# Patient Record
Sex: Male | Born: 1970 | Race: White | Hispanic: No | Marital: Single | Smoking: Never smoker
Health system: Southern US, Community
[De-identification: ages and names within clinical notes are randomized; demographics above are authoritative.]

## PROBLEM LIST (undated history)

## (undated) DIAGNOSIS — F329 Major depressive disorder, single episode, unspecified: Secondary | ICD-10-CM

## (undated) DIAGNOSIS — I10 Essential (primary) hypertension: Secondary | ICD-10-CM

## (undated) DIAGNOSIS — F32A Depression, unspecified: Secondary | ICD-10-CM

## (undated) DIAGNOSIS — J45909 Unspecified asthma, uncomplicated: Secondary | ICD-10-CM

## (undated) HISTORY — PX: HERNIA REPAIR: SHX51

---

## 2015-12-03 ENCOUNTER — Emergency Department
Admission: EM | Admit: 2015-12-03 | Discharge: 2015-12-04 | Disposition: A | Discharge status: Other Acute Inpatient Hospital | Payer: Medicare Other | Attending: Emergency Medicine | Admitting: Emergency Medicine

## 2015-12-03 ENCOUNTER — Encounter: Payer: Self-pay | Admitting: Emergency Medicine

## 2015-12-03 DIAGNOSIS — I10 Essential (primary) hypertension: Secondary | ICD-10-CM | POA: Diagnosis not present

## 2015-12-03 DIAGNOSIS — F3175 Bipolar disorder, in partial remission, most recent episode depressed: Secondary | ICD-10-CM

## 2015-12-03 DIAGNOSIS — F1310 Sedative, hypnotic or anxiolytic abuse, uncomplicated: Secondary | ICD-10-CM | POA: Diagnosis not present

## 2015-12-03 DIAGNOSIS — R0600 Dyspnea, unspecified: Secondary | ICD-10-CM

## 2015-12-03 DIAGNOSIS — Z7951 Long term (current) use of inhaled steroids: Secondary | ICD-10-CM | POA: Insufficient documentation

## 2015-12-03 DIAGNOSIS — F329 Major depressive disorder, single episode, unspecified: Secondary | ICD-10-CM | POA: Diagnosis not present

## 2015-12-03 DIAGNOSIS — G2401 Drug induced subacute dyskinesia: Secondary | ICD-10-CM

## 2015-12-03 DIAGNOSIS — R0602 Shortness of breath: Secondary | ICD-10-CM | POA: Diagnosis present

## 2015-12-03 DIAGNOSIS — J45901 Unspecified asthma with (acute) exacerbation: Secondary | ICD-10-CM | POA: Diagnosis not present

## 2015-12-03 DIAGNOSIS — Z79899 Other long term (current) drug therapy: Secondary | ICD-10-CM | POA: Diagnosis not present

## 2015-12-03 DIAGNOSIS — J45909 Unspecified asthma, uncomplicated: Secondary | ICD-10-CM

## 2015-12-03 DIAGNOSIS — F32A Depression, unspecified: Secondary | ICD-10-CM

## 2015-12-03 DIAGNOSIS — R45851 Suicidal ideations: Secondary | ICD-10-CM

## 2015-12-03 HISTORY — DX: Major depressive disorder, single episode, unspecified: F32.9

## 2015-12-03 HISTORY — DX: Unspecified asthma, uncomplicated: J45.909

## 2015-12-03 HISTORY — DX: Essential (primary) hypertension: I10

## 2015-12-03 HISTORY — DX: Depression, unspecified: F32.A

## 2015-12-03 LAB — URINE DRUG SCREEN, QUALITATIVE (ARMC ONLY)
AMPHETAMINES, UR SCREEN: NOT DETECTED
BARBITURATES, UR SCREEN: NOT DETECTED
BENZODIAZEPINE, UR SCRN: NOT DETECTED
Cannabinoid 50 Ng, Ur ~~LOC~~: NOT DETECTED
Cocaine Metabolite,Ur ~~LOC~~: NOT DETECTED
MDMA (Ecstasy)Ur Screen: NOT DETECTED
METHADONE SCREEN, URINE: NOT DETECTED
Opiate, Ur Screen: NOT DETECTED
Phencyclidine (PCP) Ur S: NOT DETECTED
TRICYCLIC, UR SCREEN: POSITIVE — AB

## 2015-12-03 LAB — COMPREHENSIVE METABOLIC PANEL
ALBUMIN: 3.7 g/dL (ref 3.5–5.0)
ALK PHOS: 100 U/L (ref 38–126)
ALT: 20 U/L (ref 17–63)
ANION GAP: 6 (ref 5–15)
AST: 20 U/L (ref 15–41)
BILIRUBIN TOTAL: 0.6 mg/dL (ref 0.3–1.2)
BUN: 17 mg/dL (ref 6–20)
CALCIUM: 8.8 mg/dL — AB (ref 8.9–10.3)
CO2: 23 mmol/L (ref 22–32)
Chloride: 109 mmol/L (ref 101–111)
Creatinine, Ser: 1.13 mg/dL (ref 0.61–1.24)
GFR calc non Af Amer: >60 mL/min (ref >60)
GLUCOSE: 101 mg/dL — AB (ref 65–99)
POTASSIUM: 3.5 mmol/L (ref 3.5–5.1)
SODIUM: 138 mmol/L (ref 135–145)
TOTAL PROTEIN: 6.7 g/dL (ref 6.5–8.1)

## 2015-12-03 LAB — ACETAMINOPHEN LEVEL

## 2015-12-03 LAB — CBC
HCT: 45.5 % (ref 40.0–52.0)
Hemoglobin: 15.3 g/dL (ref 13.0–18.0)
MCH: 27.2 pg (ref 26.0–34.0)
MCHC: 33.7 g/dL (ref 32.0–36.0)
MCV: 80.6 fL (ref 80.0–100.0)
PLATELETS: 239 10*3/uL (ref 150–440)
RBC: 5.64 MIL/uL (ref 4.40–5.90)
RDW: 13.8 % (ref 11.5–14.5)
WBC: 10.9 10*3/uL — AB (ref 3.8–10.6)

## 2015-12-03 LAB — SALICYLATE LEVEL

## 2015-12-03 LAB — ETHANOL: Alcohol, Ethyl (B): 10 mg/dL — ABNORMAL HIGH (ref <5)

## 2015-12-03 NOTE — ED Notes (Signed)
Pt in room. No complaints or concerns voiced at this time. No abnormal behavior noted at this time. Will continue to monitor with q15 min checks. ODS officer in area. 

## 2015-12-03 NOTE — ED Provider Notes (Signed)
[facility] Emergency Department Provider Note  Time seen: 6:37 PM  I have reviewed the triage vital signs and the nursing notes.   HISTORY  Chief Complaint Shortness of Breath    HPI Marc Shaw is a 44 y.o. male with a past medical history of asthma, hypertension, depression, presents the emergency department with shortness of breath and suicidal ideation. According to the patient the past several days he has felt short of breath like his asthma was acting up. Once evaluated in the emergency department he states the real reason he came is because he has been depressed and is now feeling like killing himself. Patient has a plan to overdose on his medications which he states he has done in the past.     Past Medical History  Diagnosis Date  . Asthma   . Hypertension   . Depression     There are no active problems to display for this patient.   Past Surgical History  Procedure Laterality Date  . Hernia repair      Current Outpatient Rx  Name  Route  Sig  Dispense  Refill  . albuterol (PROVENTIL HFA;VENTOLIN HFA) 108 (90 BASE) MCG/ACT inhaler   Inhalation   Inhale 2 puffs into the lungs every 4 (four) hours as needed for wheezing or shortness of breath.         . ARIPiprazole (ABILIFY) 5 MG tablet   Oral   Take 5 mg by mouth daily.         Marland Kitchen. atorvastatin (LIPITOR) 40 MG tablet   Oral   Take 40 mg by mouth every evening.         . budesonide-formoterol (SYMBICORT) 160-4.5 MCG/ACT inhaler   Inhalation   Inhale 2 puffs into the lungs 2 (two) times daily.         . butalbital-acetaminophen-caffeine (FIORICET, ESGIC) 50-325-40 MG tablet   Oral   Take 1 tablet by mouth every 8 (eight) hours as needed for headache.         . clobetasol ointment (TEMOVATE) 0.05 %   Topical   Apply 1 application topically 2 (two) times daily.         . Emollient (AQUAPHOR ADVANCED THERAPY) OINT   Apply externally   Apply 1 application topically  daily as needed.         . fluocinonide (LIDEX) 0.05 % external solution   Topical   Apply 1 application topically 2 (two) times daily.         Marland Kitchen. FLUoxetine (PROZAC) 40 MG capsule   Oral   Take 40 mg by mouth daily.         . fluticasone (FLONASE) 50 MCG/ACT nasal spray   Each Nare   Place 1 spray into both nostrils daily.         . hydrocortisone (DERMAREST ECZEMA) 1 % lotion   Topical   Apply 1 application topically 2 (two) times daily.         Marland Kitchen. ibuprofen (ADVIL,MOTRIN) 200 MG tablet   Oral   Take 200 mg by mouth every 6 (six) hours as needed.         Marland Kitchen. lisinopril (PRINIVIL,ZESTRIL) 10 MG tablet   Oral   Take 10 mg by mouth daily.         Marland Kitchen. omeprazole (PRILOSEC) 20 MG capsule   Oral   Take 40 mg by mouth daily.         . Oxcarbazepine (TRILEPTAL) 300 MG tablet  Oral   Take 300 mg by mouth 2 (two) times daily.         . polyvinyl alcohol (LIQUIFILM TEARS) 1.4 % ophthalmic solution   Both Eyes   Place 1 drop into both eyes as needed for dry eyes.         Marland Kitchen QUEtiapine (SEROQUEL) 50 MG tablet   Oral   Take 50 mg by mouth 3 (three) times daily as needed.         . traZODone (DESYREL) 100 MG tablet   Oral   Take 100 mg by mouth at bedtime.           Allergies Sulfa antibiotics  No family history on file.  Social History Social History  Substance Use Topics  . Smoking status: Never Smoker   . Smokeless tobacco: None  . Alcohol Use: No    Review of Systems Constitutional: Negative for fever. Cardiovascular: Negative for chest pain. Respiratory: Patient states several days of shortness of breath. Gastrointestinal: Negative for abdominal pain Musculoskeletal: Negative for back pain. Neurological: Negative for headache 10-point ROS otherwise negative.  ____________________________________________   PHYSICAL EXAM:  VITAL SIGNS: ED Triage Vitals  Enc Vitals Group     BP 12/03/15 1717 142/70 mmHg     Pulse Rate 12/03/15 1717  98     Resp 12/03/15 1717 18     Temp 12/03/15 1717 98.9 F (37.2 C)     Temp Source 12/03/15 1717 Oral     SpO2 12/03/15 1717 95 %     Weight 12/03/15 1717 213 lb (96.616 kg)     Height 12/03/15 1717  (1.575 m)     Head Cir --      Peak Flow --      Pain Score 12/03/15 1717 0     Pain Loc --      Pain Edu? --      Excl. in GC? --     Constitutional: Alert and oriented. Well appearing and in no distress. Eyes: Normal exam ENT   Head: Normocephalic and atraumatic.   Mouth/Throat: Mucous membranes are moist. Cardiovascular: Normal rate, regular rhythm. No murmur Respiratory: Normal respiratory effort without tachypnea nor retractions. Breath sounds are clear, no wheezes on exam. Gastrointestinal: Soft and nontender. No distention.   Musculoskeletal: Nontender with normal range of motion in all extremities. Neurologic:  Normal speech and language. No gross focal neurologic deficits  Skin:  Skin is warm, dry and intact.  Psychiatric: Flat affect, states suicidal intention of overdosing on medication.  ____________________________________________   INITIAL IMPRESSION / ASSESSMENT AND PLAN / ED COURSE  Pertinent labs & imaging results that were available during my care of the patient were reviewed by me and considered in my medical decision making (see chart for details).  Patient presents the emergency department initially with a complaint of shortness breath, however upon evaluation the patient states he is really here because he is depressed and having thoughts of killing himself. Patient has clear lung sounds on exam, no wheeze, normal oxygen saturation. In addition to his normal psychiatric labs, I will add a troponin.  Overall the patient appears very well medically, however he does have a flat affect, with active suicidality. We will place the patient under an involuntary commitment, until psychiatry can appropriately  evaluate.    ____________________________________________   FINAL CLINICAL IMPRESSION(S) / ED DIAGNOSES  Dyspnea Depression Suicidal ideation   Minna Antis, MD 12/03/15 2311

## 2015-12-03 NOTE — ED Notes (Signed)
Report received from RN Katie N.  Pt in room. No complaints or concerns voiced at this time. No abnormal behavior noted at this time. Will continue to monitor with q15 min checks. ODS officer in area. 

## 2015-12-03 NOTE — ED Notes (Signed)

## 2015-12-03 NOTE — BH Specialist Note (Signed)
Mr. Charmaine [place] is reported as being a ward of the state.  His Guardian is listed as Wynonia SoursVince McNigh [phone].  He currently is under the care of [facility] [place] Kayleen Memos- Jonte Joe -- [phone]. Persons are attempting to place a missing persons alert on Mr. Charmaine [place].

## 2015-12-03 NOTE — ED Notes (Signed)
[facility]

## 2015-12-03 NOTE — ED Notes (Signed)
C/o sob and cough, states he has hx of asthma, using inhaler with no relief, pt in no distress

## 2015-12-03 NOTE — ED Notes (Signed)
BEHAVIORAL HEALTH ROUNDING Patient sleeping: Yes.   Patient alert and oriented: not applicable Behavior appropriate: Yes.    Nutrition and fluids offered: No Toileting and hygiene offered: No Sitter present: q15 minute observations Law enforcement present: Yes [employer] 

## 2015-12-03 NOTE — BH Assessment (Signed)
Assessment Note  Marc Shaw is an 44 y.o. male. Marc Shaw reports that he arrived by EMS. He reports that he was at the [facility] and his asthma was flaring up and he felt suicidal.  He reports that the staff at [facility] called EMS due to the asthma attack. He reports that after the death of his parents in 10-May-2001 and 05/10/05 he has been having increased depressive symptoms.  He reports symptoms of anxiety and racing heart.  He reports that walking triggered his asthma. He reports that he walked about 2 miles today.  He denied having auditory or visual hallucinations.  He denied homicidal ideation or intent.  He denied use of alcohol or drugs.    Diagnosis: Depression Past Medical History:  Past Medical History  Diagnosis Date  . Asthma   . Hypertension   . Depression     Past Surgical History  Procedure Laterality Date  . Hernia repair      Family History: No family history on file.  Social History:  reports that he has never smoked. He does not have any smokeless tobacco history on file. He reports that he does not drink alcohol. His drug history is not on file.  Additional Social History:  Alcohol / Drug Use History of alcohol / drug use?: No history of alcohol / drug abuse  CIWA: CIWA-Ar BP: 131/83 mmHg Pulse Rate: 77 COWS:    Allergies:  Allergies  Allergen Reactions  . Sulfa Antibiotics Other (See Comments)    unknown    Home Medications:  (Not in a hospital admission)  OB/GYN Status:  No LMP for male patient.  General Assessment Data Location of Assessment: [facility] ED TTS Assessment: In system Is this a Tele or Face-to-Face Assessment?: Face-to-Face Is this an Initial Assessment or a Re-assessment for this encounter?: Initial Assessment Marital status: Single Maiden name: n/a Is patient pregnant?: No Pregnancy Status: No Living Arrangements: Alone Can pt return to current living arrangement?: Yes Admission Status: Voluntary Is patient capable of signing voluntary  admission?: Yes Referral Source: Self/Family/Friend Insurance type: Medicaid  Medical Screening Exam [facility] Walk-in ONLY) Medical Exam completed: Yes  Crisis Care Plan Living Arrangements: Alone Name of Psychiatrist: Denied Name of Therapist: Denied  Education Status Is patient currently in school?: No Current Grade: n/a Highest grade of school patient has completed: 12th Name of school: [employer] - [place], [place] Contact person: n/a  Risk to self with the past 6 months Suicidal Ideation: Yes-Currently Present Has patient been a risk to self within the past 6 months prior to admission? : Yes Suicidal Intent: No-Not Currently/Within Last 6 Months Has patient had any suicidal intent within the past 6 months prior to admission? : Yes Is patient at risk for suicide?: Yes Suicidal Plan?: Yes-Currently Present Has patient had any suicidal plan within the past 6 months prior to admission? : Yes Specify Current Suicidal Plan: Overdose on medications Access to Means: Yes Specify Access to Suicidal Means: has access to his medications in his home What has been your use of drugs/alcohol within the last 12 months?: None reported Previous Attempts/Gestures: Yes How many times?: 10 Other Self Harm Risks: None reported Triggers for Past Attempts: Unknown Intentional Self Injurious Behavior: None Family Suicide History: No Recent stressful life event(s):  (None reported) Persecutory voices/beliefs?: No Depression: Yes Depression Symptoms: Feeling worthless/self pity, Loss of interest in usual pleasures Substance abuse history and/or treatment for substance abuse?: No Suicide prevention information given to non-admitted patients: Not applicable  Risk to Others within the past 6 months Homicidal Ideation: No Does patient have any lifetime risk of violence toward others beyond the six months prior to admission? : No Thoughts of Harm to Others: No Current Homicidal Intent: No Current  Homicidal Plan: No Access to Homicidal Means: No Identified Victim: None reported History of harm to others?: No Assessment of Violence: None Noted Violent Behavior Description: None reported Does patient have access to weapons?: No Criminal Charges Pending?: No Does patient have a court date: No Is patient on probation?: No  Psychosis Hallucinations: None noted Delusions: None noted  Mental Status Report Appearance/Hygiene: Unremarkable, In scrubs Eye Contact: Fair Motor Activity: Unremarkable Speech: Slow, Soft Level of Consciousness: Alert Mood: Depressed Affect: Flat Anxiety Level: None Thought Processes: Coherent Judgement: Unimpaired Orientation: Person, Place, Situation, Time Obsessive Compulsive Thoughts/Behaviors: None  Cognitive Functioning Concentration: Normal Memory: Recent Intact IQ: Average Insight: Fair Impulse Control: Fair Appetite: Good Sleep: Decreased Vegetative Symptoms: None  ADLScreening [facility]) Patient's cognitive ability adequate to safely complete daily activities?: Yes Patient able to express need for assistance with ADLs?: Yes Independently performs ADLs?: Yes (appropriate for developmental age)  Prior Inpatient Therapy Prior Inpatient Therapy: Yes Prior Therapy Dates: December 2016 Prior Therapy Facilty/Provider(s): [facility] Reason for Treatment: Depression  Prior Outpatient Therapy Prior Outpatient Therapy: No Does patient have an ACCT team?: No Does patient have Intensive In-House Services?  : No Does patient have [facility] services? : No Does patient have P4CC services?: No  ADL Screening (condition at time of admission) Patient's cognitive ability adequate to safely complete daily activities?: Yes Patient able to express need for assistance with ADLs?: Yes Independently performs ADLs?: Yes (appropriate for developmental age)       Abuse/Neglect Assessment (Assessment to be complete while patient  is alone) Physical Abuse: Denies Verbal Abuse: Denies Sexual Abuse: Denies Exploitation of patient/patient's resources: Denies Self-Neglect: Denies Values / Beliefs Cultural Requests During Hospitalization: None Spiritual Requests During Hospitalization: None        Additional Information 1:1 In Past 12 Months?: No CIRT Risk: No Elopement Risk: No Does patient have medical clearance?: Yes     Disposition:  Disposition Initial Assessment Completed for this Encounter: Yes Disposition of Patient: Other dispositions (To be seen by the psychiatris)  On Site Evaluation by:   Reviewed with Physician:    Justice DeedsKeisha Gerrod Maule 12/03/2015 9:13 PM

## 2015-12-04 DIAGNOSIS — G2401 Drug induced subacute dyskinesia: Secondary | ICD-10-CM

## 2015-12-04 DIAGNOSIS — J45909 Unspecified asthma, uncomplicated: Secondary | ICD-10-CM

## 2015-12-04 DIAGNOSIS — F3175 Bipolar disorder, in partial remission, most recent episode depressed: Secondary | ICD-10-CM | POA: Diagnosis not present

## 2015-12-04 DIAGNOSIS — R45851 Suicidal ideations: Secondary | ICD-10-CM

## 2015-12-04 DIAGNOSIS — I10 Essential (primary) hypertension: Secondary | ICD-10-CM

## 2015-12-04 DIAGNOSIS — F329 Major depressive disorder, single episode, unspecified: Secondary | ICD-10-CM | POA: Diagnosis not present

## 2015-12-04 LAB — TROPONIN I

## 2015-12-04 MED ORDER — ALBUTEROL SULFATE HFA 108 (90 BASE) MCG/ACT IN AERS
2.0000 | INHALATION_SPRAY | RESPIRATORY_TRACT | Status: DC | PRN
Start: 2015-12-04 — End: 2015-12-04
  Filled 2015-12-04: qty 6.7

## 2015-12-04 MED ORDER — BUDESONIDE-FORMOTEROL FUMARATE 160-4.5 MCG/ACT IN AERO
2.0000 | INHALATION_SPRAY | Freq: Two times a day (BID) | RESPIRATORY_TRACT | Status: DC
  Administered 2015-12-04: 2 via RESPIRATORY_TRACT
  Filled 2015-12-04: qty 6

## 2015-12-04 MED ORDER — TRAZODONE HCL 100 MG PO TABS
100.0000 mg | ORAL_TABLET | Freq: Every day | ORAL | Status: DC
  Administered 2015-12-04: 100 mg via ORAL
  Filled 2015-12-04: qty 1

## 2015-12-04 MED ORDER — ATORVASTATIN CALCIUM 20 MG PO TABS: 40.0000 mg | ORAL_TABLET | Freq: Every day | ORAL | Status: DC

## 2015-12-04 MED ORDER — PANTOPRAZOLE SODIUM 40 MG PO TBEC
40.0000 mg | DELAYED_RELEASE_TABLET | Freq: Every day | ORAL | Status: DC
  Administered 2015-12-04: 40 mg via ORAL
  Filled 2015-12-04: qty 1

## 2015-12-04 MED ORDER — LISINOPRIL 10 MG PO TABS
10.0000 mg | ORAL_TABLET | Freq: Once | ORAL | Status: AC
  Administered 2015-12-04: 10 mg via ORAL
  Filled 2015-12-04: qty 1

## 2015-12-04 MED ORDER — OXCARBAZEPINE 300 MG PO TABS
300.0000 mg | ORAL_TABLET | Freq: Two times a day (BID) | ORAL | Status: DC
  Administered 2015-12-04 (×2): 300 mg via ORAL
  Filled 2015-12-04 (×2): qty 1

## 2015-12-04 MED ORDER — ARIPIPRAZOLE 5 MG PO TABS
5.0000 mg | ORAL_TABLET | Freq: Every day | ORAL | Status: DC
  Administered 2015-12-04: 5 mg via ORAL
  Filled 2015-12-04: qty 1

## 2015-12-04 MED ORDER — FLUOXETINE HCL 20 MG PO CAPS
40.0000 mg | ORAL_CAPSULE | Freq: Every day | ORAL | Status: DC
  Administered 2015-12-04: 40 mg via ORAL
  Filled 2015-12-04: qty 2

## 2015-12-04 NOTE — ED Notes (Signed)
Pt. Noted in room resting quietly;. No complaints or concerns voiced. No distress or abnormal behavior noted. Will continue to monitor with security cameras. Q 15 minute rounds continue. 

## 2015-12-04 NOTE — ED Notes (Signed)
Pt. Noted in room sitting on the side of the bed; . No complaints or concerns voiced. No distress or abnormal behavior noted. Will continue to monitor with security cameras. Q 15 minute rounds continue. 

## 2015-12-04 NOTE — ED Notes (Signed)

## 2015-12-04 NOTE — ED Notes (Signed)
Patient discharged ambulatory to group home, accompanied by caregiver. He denies SI or HI. Discharge instructions reviewed with patient, verbalizes understanding. Patient received copy of DC plan and all personal belongings.

## 2015-12-04 NOTE — ED Notes (Signed)
Patient meeting with psychiatrist. Currently calm and cooperative, denies SI or HI. No evidence of psychosis. Maintained on 15 minute checks and observation by security camera for safety. 

## 2015-12-04 NOTE — ED Notes (Addendum)
A telephone call was placed to client's guardian--Vince Mcknight--[phone] @ 23:40 per [place] Police Officer [place]; as per client has been reported as a missing person. Per Mr. Ledon SnareMcKnight; Mr. Charmaine Down[place]Dodge was released from [facility] yesterday; and his guardian had gotten him a room @ [facility] @ [address] [place], [place]. Per Mr. Ledon SnareMcKnight, Mr. Charmaine [place] wants to be in a hospital to be taken care of; and Mr. Charmaine [place] spoke with Mr. Ledon SnareMcKnight @ 23:50; and Mr. Charmaine [place] stated after he spoke with his guardian, "I'm willing to go back to [facility]." Mr. Ledon SnareMcKnight said that the staff here can call him at any time and to also contact [facility] [place]--[phone] and [facility]Ashley Marrow.

## 2015-12-04 NOTE — ED Notes (Signed)
Pt. Noted in room watching the tv and requesting medications; ER-MD made aware.. No complaints or concerns voiced. No distress or abnormal behavior noted. Will continue to monitor with security cameras. Q 15 minute rounds continue.

## 2015-12-04 NOTE — ED Notes (Signed)
Patient aware that he will be discharged back to group home later today. Discharge instructions reviewed with patient and e-signature obtained. 

## 2015-12-04 NOTE — ED Notes (Signed)
Patient resting quietly in room. No noted distress or abnormal behaviors noted. Will continue 15 minute checks and observation by security camera for safety. 

## 2015-12-04 NOTE — ED Provider Notes (Signed)
-----------------------------------------   7:22 AM on 12/04/2015 -----------------------------------------   Blood pressure 131/83, pulse 77, temperature 98 F (36.7 C), temperature source Oral, resp. rate 20, height 5\' 2"  (1.575 m), weight 213 lb (96.616 kg), SpO2 97 %.  The patient had no acute events since last update.  Calm and cooperative at this time.  Disposition is pending per Psychiatry/Behavioral Medicine team recommendations.     Rebecka ApleyAllison P Webster, MD 12/04/15 [id number]

## 2015-12-04 NOTE — Consult Note (Signed)
[facility] Consult   Reason for Consult:  Consult for this 44 year old man who came into the hospital last night stating suicidal thoughts Referring Physician:  [place] Patient Identification: Marc Shaw MRN:  [id number] Principal Diagnosis: Bipolar 1 disorder, depressed, partial remission ([place]) Diagnosis:   Patient Active Problem List   Diagnosis Date Noted  . Bipolar 1 disorder, depressed, partial remission ([place]) [F31.75] 12/04/2015  . Asthma [J45.909] 12/04/2015  . Hypertension [I10] 12/04/2015  . Suicidal ideation [R45.851] 12/04/2015  . Tardive dyskinesia [G24.01] 12/04/2015    Total Time spent with patient: 1 hour  Subjective:   Marc Shaw is a 44 y.o. male patient admitted with "last night I told the nurse I wanted to kill myself but that is not true".  HPI:  Patient came into the hospital last night by EMS. He tells me that EMS was originally called because he was out in public at a gas station and was having shortness of breath because his asthma was bad. When he was evaluated here last night apparently he said he was having suicidal thoughts. Patient admits that he said this but says that he did not actually want to kill himself and is not having any suicidal thoughts now. His mood has been down and sad a little bit. He has chronic mood disorder symptoms and just recently moved into a new group home. Additionally he misses his family particularly bad at this time of the year. He is feeling lonely. Sleep is adequate. Appetite is normal. He denies that he's having any auditory or visual hallucinations. Denies any suicidal or homicidal ideas. He says he is taking all of his medicines although he can only remember Prozac among his psychiatric medicines he knows he takes others.  Patient apparently just moved into this new group home within the last week. Prior to that he had been in a psychiatric admission to a hospital in [place]. Patient had been living mostly in  [place] in the homeless shelter. He is new to our community. Not clear to me right now who is being assigned to prescribe his psychiatric medicine.  Social history: Patient is originally from [place] but has lived in [place] for over 20 years. He lived with his parents until they died about 10 years ago. Since then has been in homeless shelters or group homes. Doesn't have any other family he stays in close contact with. He does have a legally appointed guardian.  Medical history: States he has high blood pressure and he has asthma. Has to use an inhaler every day or he gets short of breath. Denies any history of heart attack or stroke.  Substance abuse history: Patient denies that he drinks or abuses any other drugs and says he has never had a drug or alcohol problem.   Past Psychiatric History: Patient says he has had mental health problems all of his life. He graduated from high school but never worked or did anything but stay with his family. He estimates that he's had about 15 total psychiatric hospitalizations in his life. He has had suicide attempts in the past most recently a couple months ago when he overdosed on an antidepressant. He says he's been diagnosed with bipolar disorder.   Risk to Self: Suicidal Ideation: Yes-Currently Present Suicidal Intent: No-Not Currently/Within Last 6 Months Is patient at risk for suicide?: Yes Suicidal Plan?: Yes-Currently Present Specify Current Suicidal Plan: Overdose on medications Access to Means: Yes Specify Access to Suicidal Means: has access to his medications  in his home What has been your use of drugs/alcohol within the last 12 months?: None reported How many times?: 10 Other Self Harm Risks: None reported Triggers for Past Attempts: Unknown Intentional Self Injurious Behavior: None Risk to Others: Homicidal Ideation: No Thoughts of Harm to Others: No Current Homicidal Intent: No Current Homicidal Plan: No Access to  Homicidal Means: No Identified Victim: None reported History of harm to others?: No Assessment of Violence: None Noted Violent Behavior Description: None reported Does patient have access to weapons?: No Criminal Charges Pending?: No Does patient have a court date: No Prior Inpatient Therapy: Prior Inpatient Therapy: Yes Prior Therapy Dates: December 2016 Prior Therapy Facilty/Provider(s): [facility] Reason for Treatment: Depression Prior Outpatient Therapy: Prior Outpatient Therapy: No Does patient have an ACCT team?: No Does patient have Intensive In-House Services?  : No Does patient have [facility] services? : No Does patient have P4CC services?: No  Past Medical History:  Past Medical History  Diagnosis Date  . Asthma   . Hypertension   . Depression     Past Surgical History  Procedure Laterality Date  . Hernia repair     Family History: No family history on file. Family Psychiatric  History: Patient says he does not know there being any family history of mental health or substance abuse problems  Social History:  History  Alcohol Use No     History  Drug Use Not on file    Social History   Social History  . Marital Status: Single    Spouse Name: N/A  . Number of Children: N/A  . Years of Education: N/A   Social History Main Topics  . Smoking status: Never Smoker   . Smokeless tobacco: None  . Alcohol Use: No  . Drug Use: None  . Sexual Activity: Not Asked   Other Topics Concern  . None   Social History Narrative  . None   Additional Social History:    History of alcohol / drug use?: No history of alcohol / drug abuse                     Allergies:   Allergies  Allergen Reactions  . Sulfa Antibiotics Other (See Comments)    unknown    Labs:  Results for orders placed or performed during the hospital encounter of 12/03/15 (from the past 48 hour(s))  Comprehensive metabolic panel     Status: Abnormal   Collection Time: 12/03/15   6:07 PM  Result Value Ref Range   Sodium 138 135 - 145 mmol/L   Potassium 3.5 3.5 - 5.1 mmol/L   Chloride 109 101 - 111 mmol/L   CO2 23 22 - 32 mmol/L   Glucose, Bld 101 (H) 65 - 99 mg/dL   BUN 17 6 - 20 mg/dL   Creatinine, Ser 1.13 0.61 - 1.24 mg/dL   Calcium 8.8 (L) 8.9 - 10.3 mg/dL   Total Protein 6.7 6.5 - 8.1 g/dL   Albumin 3.7 3.5 - 5.0 g/dL   AST 20 15 - 41 U/L   ALT 20 17 - 63 U/L   Alkaline Phosphatase 100 38 - 126 U/L   Total Bilirubin 0.6 0.3 - 1.2 mg/dL   GFR calc non Af Amer >60 >60 mL/min   GFR calc Af Amer >60 >60 mL/min    Comment: (NOTE) The eGFR has been calculated using the CKD EPI equation. This calculation has not been validated in all clinical situations. eGFR's  persistently <60 mL/min signify possible Chronic Kidney Disease.    Anion gap 6 5 - 15  Ethanol (ETOH)     Status: Abnormal   Collection Time: 12/03/15  6:07 PM  Result Value Ref Range   Alcohol, Ethyl (B) 10 (H) <5 mg/dL    Comment:        LOWEST DETECTABLE LIMIT FOR SERUM ALCOHOL IS 5 mg/dL FOR MEDICAL PURPOSES ONLY   Salicylate level     Status: None   Collection Time: 12/03/15  6:07 PM  Result Value Ref Range   Salicylate Lvl <7.5 2.8 - 30.0 mg/dL  Acetaminophen level     Status: Abnormal   Collection Time: 12/03/15  6:07 PM  Result Value Ref Range   Acetaminophen (Tylenol), Serum <10 (L) 10 - 30 ug/mL    Comment:        THERAPEUTIC CONCENTRATIONS VARY SIGNIFICANTLY. A RANGE OF 10-30 ug/mL MAY BE AN EFFECTIVE CONCENTRATION FOR MANY PATIENTS. HOWEVER, SOME ARE BEST TREATED AT CONCENTRATIONS OUTSIDE THIS RANGE. ACETAMINOPHEN CONCENTRATIONS >150 ug/mL AT 4 HOURS AFTER INGESTION AND >50 ug/mL AT 12 HOURS AFTER INGESTION ARE OFTEN ASSOCIATED WITH TOXIC REACTIONS.   CBC     Status: Abnormal   Collection Time: 12/03/15  6:07 PM  Result Value Ref Range   WBC 10.9 (H) 3.8 - 10.6 K/uL   RBC 5.64 4.40 - 5.90 MIL/uL   Hemoglobin 15.3 13.0 - 18.0 g/dL   HCT 45.5 40.0 - 52.0 %   MCV  80.6 80.0 - 100.0 fL   MCH 27.2 26.0 - 34.0 pg   MCHC 33.7 32.0 - 36.0 g/dL   RDW 13.8 11.5 - 14.5 %   Platelets 239 150 - 440 K/uL  Urine Drug Screen, Qualitative ([facility] only)     Status: Abnormal   Collection Time: 12/03/15  6:07 PM  Result Value Ref Range   Tricyclic, Ur Screen POSITIVE (A) NONE DETECTED   Amphetamines, Ur Screen NONE DETECTED NONE DETECTED   MDMA (Ecstasy)Ur Screen NONE DETECTED NONE DETECTED   Cocaine Metabolite,Ur [employer] NONE DETECTED NONE DETECTED   Opiate, Ur Screen NONE DETECTED NONE DETECTED   Phencyclidine (PCP) Ur S NONE DETECTED NONE DETECTED   Cannabinoid 50 Ng, Ur [place] NONE DETECTED NONE DETECTED   Barbiturates, Ur Screen NONE DETECTED NONE DETECTED   Benzodiazepine, Ur Scrn NONE DETECTED NONE DETECTED   Methadone Scn, Ur NONE DETECTED NONE DETECTED    Comment: (NOTE) 170  Tricyclics, urine               Cutoff 1000 ng/mL 200  Amphetamines, urine             Cutoff 1000 ng/mL 300  MDMA (Ecstasy), urine           Cutoff 500 ng/mL 400  Cocaine Metabolite, urine       Cutoff 300 ng/mL 500  Opiate, urine                   Cutoff 300 ng/mL 600  Phencyclidine (PCP), urine      Cutoff 25 ng/mL 700  Cannabinoid, urine              Cutoff 50 ng/mL 800  Barbiturates, urine             Cutoff 200 ng/mL 900  Benzodiazepine, urine           Cutoff 200 ng/mL 1000 Methadone, urine  Cutoff 300 ng/mL 1100 1200 The urine drug screen provides only a preliminary, unconfirmed 1300 analytical test result and should not be used for non-medical 1400 purposes. Clinical consideration and professional judgment should 1500 be applied to any positive drug screen result due to possible 1600 interfering substances. A more specific alternate chemical method 1700 must be used in order to obtain a confirmed analytical result.  1800 Gas chromato graphy / mass spectrometry (GC/MS) is the preferred 1900 confirmatory method.   Troponin I     Status: None   Collection Time:  12/03/15  6:17 PM  Result Value Ref Range   Troponin I <0.03 <0.031 ng/mL    Current Facility-Administered Medications  Medication Dose Route Frequency Provider Last Rate Last Dose  . albuterol (PROVENTIL HFA;VENTOLIN HFA) 108 (90 BASE) MCG/ACT inhaler 2 puff  2 puff Inhalation Q4H PRN Harvest Dark, MD      . ARIPiprazole (ABILIFY) tablet 5 mg  5 mg Oral Daily Harvest Dark, MD   5 mg at 12/04/15 0909  . atorvastatin (LIPITOR) tablet 40 mg  40 mg Oral q1800 Harvest Dark, MD      . budesonide-formoterol [facility]) 160-4.5 MCG/ACT inhaler 2 puff  2 puff Inhalation BID Harvest Dark, MD   2 puff at 12/04/15 0910  . FLUoxetine (PROZAC) capsule 40 mg  40 mg Oral Daily Harvest Dark, MD   40 mg at 12/04/15 0909  . Oxcarbazepine (TRILEPTAL) tablet 300 mg  300 mg Oral BID Harvest Dark, MD   300 mg at 12/04/15 0909  . pantoprazole (PROTONIX) EC tablet 40 mg  40 mg Oral QAC breakfast Harvest Dark, MD   40 mg at 12/04/15 0910  . traZODone (DESYREL) tablet 100 mg  100 mg Oral QHS Harvest Dark, MD   100 mg at 12/04/15 0031   Current Outpatient Prescriptions  Medication Sig Dispense Refill  . albuterol (PROVENTIL HFA;VENTOLIN HFA) 108 (90 BASE) MCG/ACT inhaler Inhale 2 puffs into the lungs every 4 (four) hours as needed for wheezing or shortness of breath.    . ARIPiprazole (ABILIFY) 5 MG tablet Take 5 mg by mouth daily.    Marland Kitchen atorvastatin (LIPITOR) 40 MG tablet Take 40 mg by mouth every evening.    . budesonide-formoterol (SYMBICORT) 160-4.5 MCG/ACT inhaler Inhale 2 puffs into the lungs 2 (two) times daily.    . clobetasol ointment (TEMOVATE) 1.47 % Apply 1 application topically 2 (two) times daily.    Marland Kitchen FLUoxetine (PROZAC) 40 MG capsule Take 40 mg by mouth daily.    . fluticasone (FLONASE) 50 MCG/ACT nasal spray Place 1 spray into both nostrils daily.    Marland Kitchen ibuprofen (ADVIL,MOTRIN) 200 MG tablet Take 200 mg by mouth every 6 (six) hours as needed.    Marland Kitchen lisinopril  (PRINIVIL,ZESTRIL) 10 MG tablet Take 10 mg by mouth daily.    Marland Kitchen omeprazole (PRILOSEC) 20 MG capsule Take 40 mg by mouth daily.    . Oxcarbazepine (TRILEPTAL) 300 MG tablet Take 300 mg by mouth 2 (two) times daily.    . QUEtiapine (SEROQUEL) 50 MG tablet Take 50 mg by mouth 3 (three) times daily as needed.    . traZODone (DESYREL) 100 MG tablet Take 100 mg by mouth at bedtime.    . butalbital-acetaminophen-caffeine (FIORICET, ESGIC) 50-325-40 MG tablet Take 1 tablet by mouth every 8 (eight) hours as needed for headache.    . Emollient (AQUAPHOR ADVANCED THERAPY) OINT Apply 1 application topically daily as needed.    . fluocinonide (LIDEX) 0.05 % external  solution Apply 1 application topically 2 (two) times daily.    . hydrocortisone (DERMAREST ECZEMA) 1 % lotion Apply 1 application topically 2 (two) times daily.    . polyvinyl alcohol (LIQUIFILM TEARS) 1.4 % ophthalmic solution Place 1 drop into both eyes as needed for dry eyes.      Musculoskeletal: Strength & Muscle Tone: within normal limits Gait & Station: normal Patient leans: N/A  Psychiatric Specialty Exam: Review of Systems  Constitutional: Negative.   HENT: Negative.   Eyes: Negative.   Respiratory: Negative.   Cardiovascular: Negative.   Gastrointestinal: Negative.   Musculoskeletal: Negative.   Skin: Negative.   Neurological: Negative.   Psychiatric/Behavioral: Negative for depression, suicidal ideas, hallucinations, memory loss and substance abuse. The patient is not nervous/anxious and does not have insomnia.     Blood pressure 129/66, pulse 71, temperature 97.6 F (36.4 C), temperature source Oral, resp. rate 16, height 5' 2"  (1.575 m), weight 96.616 kg (213 lb), SpO2 99 %.Body mass index is 38.95 kg/(m^2).  General Appearance: Disheveled  Engineer, water::  Fair  Speech:  Slow  Volume:  Decreased  Mood:  Euthymic  Affect:  Constricted  Thought Process:  Goal Directed  Orientation:  Full (Time, Place, and Person)   Thought Content:  Negative  Suicidal Thoughts:  No  Homicidal Thoughts:  No  Memory:  Immediate;   Good Recent;   Fair Remote;   Fair  Judgement:  Fair  Insight:  Fair  Psychomotor Activity:  Decreased and TD  Concentration:  Fair  Recall:  [employer] of Knowledge:Fair  Language: Fair  Akathisia:  No  Handed:  Right  AIMS (if indicated):     Assets:  Communication Skills Desire for Improvement Financial Resources/Insurance Housing Resilience Social Support  ADL's:  Intact  Cognition: WNL  Sleep:      Treatment Plan Summary: Medication management and Plan This is a 44 year old man with chronic mental health problems who just recently relocated to our area. He made a suicidal statement yesterday but it sounds like it was fairly impulsive. He did not do anything to try and kill himself and he denies having any suicidal thoughts now. He admits that he's been feeling a little bit down recently because it's the holiday season and he is lonely. He has been compliant with his medicine and he denies any psychotic symptoms. Patient appears to me to be a little bit cognitively slow and in addition he appears to probably have tardive dyskinesia. As far as his depression symptoms seem to be improving and stabilizing. He is currently on medication that appears to include Seroquel and Prozac possibly also Tegretol and Abilify. Patient should have an outpatient provider in the community since he was just discharged from the hospital. He needs to continue current psychiatric medicine and follow-up with outpatient psychiatric care. Patient's asthma has stabilized. He will stay on his usual medication. Blood pressure was high when he first came in but has stabilized and improved now and he will continue current medicine. Suicidal thoughts have improved. The dyskinesia appears to be stable. No intervention required at this point. Case discussed with emergency room doctor. Involuntary commitment  discontinued. He can be discharged back to his group home.  Disposition: Patient does not meet criteria for psychiatric inpatient admission. Supportive therapy provided about ongoing stressors. Discussed crisis plan, support from social network, calling 911, coming to the Emergency Department, and calling Suicide Hotline.  Marc Shaw 12/04/2015 12:35 PM

## 2015-12-04 NOTE — ED Provider Notes (Signed)
-----------------------------------------   1:09 PM on 12/04/2015 -----------------------------------------   Blood pressure 129/66, pulse 71, temperature 97.6 F (36.4 C), temperature source Oral, resp. rate 16, height 5\' 2"  (1.575 m), weight 213 lb (96.616 kg), SpO2 99 %.  The patient had no acute events since last update.  Calm and cooperative at this time.  Seen and evaluated by the psychiatrist, Dr. Toni Amendlapacs who recommends discharge to home. Will give follow-up with [facility]. Patient is calm and cooperative at this time. Denies any suicidal or homicidal ideation.    Myrna Blazeravid Matthew Schaevitz, MD 12/04/15 [phone]

## 2015-12-04 NOTE — ED Notes (Signed)
Patient asleep in room. No noted distress or abnormal behavior. Will continue 15 minute checks and observation by security cameras for safety. 

## 2015-12-04 NOTE — Progress Notes (Signed)
LCSW contacted patients guardian Marc Shaw [phone] and he will advise [facility] that patient is discharged and ready for pick up. He will be picked up between 1-3 this afternoon. In consult with psychiatrist patient is not homicidal or suicidal and will access other supports if he starts to feel overwhelmed.

## 2015-12-25 ENCOUNTER — Emergency Department
Admission: EM | Admit: 2015-12-25 | Discharge: 2015-12-26 | Disposition: A | Discharge status: Home / Self Care | Payer: Medicare Other | Attending: Emergency Medicine | Admitting: Emergency Medicine

## 2015-12-25 ENCOUNTER — Encounter: Payer: Self-pay | Admitting: Medical Oncology

## 2015-12-25 DIAGNOSIS — Z7951 Long term (current) use of inhaled steroids: Secondary | ICD-10-CM | POA: Diagnosis not present

## 2015-12-25 DIAGNOSIS — I10 Essential (primary) hypertension: Secondary | ICD-10-CM | POA: Diagnosis present

## 2015-12-25 DIAGNOSIS — R45851 Suicidal ideations: Secondary | ICD-10-CM | POA: Diagnosis present

## 2015-12-25 DIAGNOSIS — F3175 Bipolar disorder, in partial remission, most recent episode depressed: Secondary | ICD-10-CM | POA: Diagnosis not present

## 2015-12-25 DIAGNOSIS — G2401 Drug induced subacute dyskinesia: Secondary | ICD-10-CM | POA: Diagnosis present

## 2015-12-25 DIAGNOSIS — Z79899 Other long term (current) drug therapy: Secondary | ICD-10-CM | POA: Insufficient documentation

## 2015-12-25 DIAGNOSIS — J45909 Unspecified asthma, uncomplicated: Secondary | ICD-10-CM | POA: Diagnosis present

## 2015-12-25 DIAGNOSIS — F1510 Other stimulant abuse, uncomplicated: Secondary | ICD-10-CM

## 2015-12-25 LAB — CBC
HCT: 45.1 % (ref 40.0–52.0)
Hemoglobin: 14.9 g/dL (ref 13.0–18.0)
MCH: 26.1 pg (ref 26.0–34.0)
MCHC: 33 g/dL (ref 32.0–36.0)
MCV: 79 fL — AB (ref 80.0–100.0)
PLATELETS: 178 10*3/uL (ref 150–440)
RBC: 5.71 MIL/uL (ref 4.40–5.90)
RDW: 14 % (ref 11.5–14.5)
WBC: 8.5 10*3/uL (ref 3.8–10.6)

## 2015-12-25 LAB — COMPREHENSIVE METABOLIC PANEL
ALT: 14 U/L — AB (ref 17–63)
AST: 18 U/L (ref 15–41)
Albumin: 4.1 g/dL (ref 3.5–5.0)
Alkaline Phosphatase: 82 U/L (ref 38–126)
Anion gap: 5 (ref 5–15)
BILIRUBIN TOTAL: 0.5 mg/dL (ref 0.3–1.2)
BUN: 15 mg/dL (ref 6–20)
CHLORIDE: 109 mmol/L (ref 101–111)
CO2: 25 mmol/L (ref 22–32)
CREATININE: 1.12 mg/dL (ref 0.61–1.24)
Calcium: 9 mg/dL (ref 8.9–10.3)
Glucose, Bld: 117 mg/dL — ABNORMAL HIGH (ref 65–99)
Potassium: 3.4 mmol/L — ABNORMAL LOW (ref 3.5–5.1)
Sodium: 139 mmol/L (ref 135–145)
TOTAL PROTEIN: 6.9 g/dL (ref 6.5–8.1)

## 2015-12-25 LAB — URINE DRUG SCREEN, QUALITATIVE (ARMC ONLY)
Amphetamines, Ur Screen: POSITIVE — AB
BARBITURATES, UR SCREEN: POSITIVE — AB
BENZODIAZEPINE, UR SCRN: NOT DETECTED
CANNABINOID 50 NG, UR ~~LOC~~: NOT DETECTED
Cocaine Metabolite,Ur ~~LOC~~: NOT DETECTED
MDMA (Ecstasy)Ur Screen: NOT DETECTED
Methadone Scn, Ur: NOT DETECTED
Opiate, Ur Screen: NOT DETECTED
PHENCYCLIDINE (PCP) UR S: NOT DETECTED
TRICYCLIC, UR SCREEN: POSITIVE — AB

## 2015-12-25 LAB — ETHANOL

## 2015-12-25 LAB — SALICYLATE LEVEL

## 2015-12-25 LAB — ACETAMINOPHEN LEVEL: Acetaminophen (Tylenol), Serum: <10 ug/mL — ABNORMAL LOW (ref 10–30)

## 2015-12-25 MED ORDER — ACETAMINOPHEN 500 MG PO TABS
ORAL_TABLET | ORAL | Status: AC
  Administered 2015-12-25: 1000 mg via ORAL
  Filled 2015-12-25: qty 2

## 2015-12-25 MED ORDER — ACETAMINOPHEN 500 MG PO TABS
1000.0000 mg | ORAL_TABLET | Freq: Once | ORAL | Status: AC
  Administered 2015-12-25: 1000 mg via ORAL

## 2015-12-25 NOTE — ED Notes (Addendum)
[phone]- Guardian phone number.

## 2015-12-25 NOTE — ED Notes (Signed)
Pt reports that he began having suicidal thoughts last night, reports plan to Overdose on his medications. Pt denies HI.

## 2015-12-25 NOTE — BH Assessment (Signed)
Assessment Note  Marc Shaw is an 44 y.o. male who presents to the ER via his friends/roommates, due to voicing SI with the plan of overdosing on his medications. Patient states, he has been depressed for several days. He was unable to share the specifics as to why he is depressed.   Patient is reports of having A Guardian and they are through "[employer]," and the main contact is [place], [phone]. Patient moved to [facility] approximately a month ago. Prior to the move, he was living in [place] in a homeless Shelter.  Patient gives limited information about what is going on.  He was guarded and majority of his answers are "I don't know..."  According to the On Call Guardian ([phone]) patient is living in a [address], due to "burning his bridges in [place]."  On today, he became frustrated and have a history of using the ER as a way to cope. He's been flagged at majority of the [facility] and he is not to be admitted or seen unless it's a true medical emergency.  He does have a history of swollen things, as a way to get admitted in the hospital but never with the intent of harming himself. Guardian expressed, "while in the ER, make it less comfortable as possible, make it miserable for him. If you make it too comfortable, he'll keep coming back. If he knows he going to stay, trust it won't be the last time you see him... There is no reason why he should have come to the ER. He called me today because he was frustrated about nothing. So he said he was going to the ER. At this boarding house, she(owner) cook for him three times a day, give him free transportation and everything. There is no logical reason as to why he came up there. He likes going to [employer]..."  Guardian also states, have no safety concerns for the patient. "He'll threaten self-harm but he will never do it..."   Diagnosis: Depression  Past Medical History:  Past Medical History  Diagnosis Date  . Asthma   .  Hypertension   . Depression     Past Surgical History  Procedure Laterality Date  . Hernia repair      Family History: No family history on file.  Social History:  reports that he has never smoked. He does not have any smokeless tobacco history on file. He reports that he does not drink alcohol. His drug history is not on file.  Additional Social History:  Alcohol / Drug Use Pain Medications: See PTA Prescriptions: See PTA Over the Counter: See PTA History of alcohol / drug use?: No history of alcohol / drug abuse Longest period of sobriety (when/how long): States he have no past history of abuse Negative Consequences of Use:  (States he have no past history of abuse) Withdrawal Symptoms:  (States he have no past history of abuse)  CIWA: CIWA-Ar BP: (!) 154/70 mmHg Pulse Rate: 76 COWS:    Allergies:  Allergies  Allergen Reactions  . Sulfa Antibiotics Other (See Comments)    unknown    Home Medications:  (Not in a hospital admission)  OB/GYN Status:  No LMP for male patient.  General Assessment Data Location of Assessment: [facility] ED TTS Assessment: In system Is this a Tele or Face-to-Face Assessment?: Face-to-Face Is this an Initial Assessment or a Re-assessment for this encounter?: Initial Assessment Marital status: Single Maiden name: n/a Is patient pregnant?: No Pregnancy Status:  No Living Arrangements: Non-relatives/Friends Can pt return to current living arrangement?: No Admission Status: Voluntary Is patient capable of signing voluntary admission?: No Referral Source: Self/Family/Friend Insurance type: Medicaid  Medical Screening Exam [facility] Walk-in ONLY) Medical Exam completed: Yes  Crisis Care Plan Living Arrangements: Non-relatives/Friends Legal Guardian: Other: (Durenda Hurt [employer] of [phone]) Name of Psychiatrist: None Name of Therapist: None  Education Status Is patient currently in school?: No Current Grade: n/a Highest grade of school  patient has completed: High School Diploma Name of school: [employer] - [place], [place] Contact person: n/a  Risk to self with the past 6 months Suicidal Ideation: Yes-Currently Present Has patient been a risk to self within the past 6 months prior to admission? : Yes Suicidal Intent: Yes-Currently Present Has patient had any suicidal intent within the past 6 months prior to admission? : Yes Is patient at risk for suicide?: Yes Suicidal Plan?: Yes-Currently Present Has patient had any suicidal plan within the past 6 months prior to admission? : Yes Specify Current Suicidal Plan: Overdose on medication Access to Means: Yes Specify Access to Suicidal Means: Have medications What has been your use of drugs/alcohol within the last 12 months?: None Previous Attempts/Gestures: Yes How many times?: 1 Other Self Harm Risks: overdose in the past, 09/2015 Triggers for Past Attempts: None known Intentional Self Injurious Behavior: None Family Suicide History: No Recent stressful life event(s): Other (Comment) (States of having none) Persecutory voices/beliefs?: No Depression: Yes Depression Symptoms: Loss of interest in usual pleasures, Guilt, Fatigue Substance abuse history and/or treatment for substance abuse?: No Suicide prevention information given to non-admitted patients: Not applicable  Risk to Others within the past 6 months Homicidal Ideation: No Does patient have any lifetime risk of violence toward others beyond the six months prior to admission? : No Thoughts of Harm to Others: No Current Homicidal Intent: No Current Homicidal Plan: No Access to Homicidal Means: No Identified Victim: None Reported History of harm to others?: No Assessment of Violence: None Noted Violent Behavior Description: None Reported Does patient have access to weapons?: No Criminal Charges Pending?: No Does patient have a court date: No Is patient on probation?: No  Psychosis Hallucinations:  None noted Delusions: None noted  Mental Status Report Appearance/Hygiene: In hospital gown, In scrubs, Unremarkable Eye Contact: Fair Motor Activity: Freedom of movement, Unremarkable Speech: Logical/coherent, Unremarkable Level of Consciousness: Alert Mood: Depressed, Sad, Pleasant Affect: Appropriate to circumstance Anxiety Level: None Thought Processes: Coherent, Relevant Judgement: Partial Orientation: Person, Place, Time, Situation, Appropriate for developmental age Obsessive Compulsive Thoughts/Behaviors: None  Cognitive Functioning Concentration: Normal Memory: Recent Intact, Remote Intact IQ: Average Insight: Fair Impulse Control: Poor Appetite: Good Weight Loss: 0 Weight Gain: 0 Sleep: No Change Total Hours of Sleep: 8 Vegetative Symptoms: None  ADLScreening [facility] Assessment Services) Patient's cognitive ability adequate to safely complete daily activities?: Yes Patient able to express need for assistance with ADLs?: Yes Independently performs ADLs?: Yes (appropriate for developmental age)  Prior Inpatient Therapy Prior Inpatient Therapy: Yes Prior Therapy Dates: 73 (He states he been inpatient with them 5 times) Prior Therapy Facilty/Provider(s): [facility], "Waldemar Dickens"  Reason for Treatment: Depression  Prior Outpatient Therapy Prior Outpatient Therapy: Yes Prior Therapy Dates: 10/2015 Prior Therapy Facilty/Provider(s): Patient doesn't remember the name of the company Reason for Treatment: Depression-Medication Management Does patient have an ACCT team?: Unknown Does patient have Intensive In-House Services?  : No Does patient have [facility] services? : No Does patient have P4CC services?: No  ADL Screening (condition at time of admission) Patient's cognitive ability adequate to safely complete daily activities?: Yes Is the patient deaf or have difficulty hearing?: No Does the patient have difficulty seeing, even when wearing glasses/contacts?:  No Does the patient have difficulty concentrating, remembering, or making decisions?: No Patient able to express need for assistance with ADLs?: Yes Does the patient have difficulty dressing or bathing?: No Independently performs ADLs?: Yes (appropriate for developmental age) Does the patient have difficulty walking or climbing stairs?: No Weakness of Legs: None Weakness of Arms/Hands: None  Home Assistive Devices/Equipment Home Assistive Devices/Equipment: None  Therapy Consults (therapy consults require a physician order) PT Evaluation Needed: No OT Evalulation Needed: No SLP Evaluation Needed: No Abuse/Neglect Assessment (Assessment to be complete while patient is alone) Physical Abuse: Denies Verbal Abuse: Denies Sexual Abuse: Denies Exploitation of patient/patient's resources: Denies Self-Neglect: Denies Values / Beliefs Cultural Requests During Hospitalization: None Spiritual Requests During Hospitalization: None Consults Spiritual Care Consult Needed: No Social Work Consult Needed: No Merchant navy officerAdvance Directives (For Healthcare) Does patient have an advance directive?: No Would patient like information on creating an advanced directive?: No - patient declined information    Additional Information 1:1 In Past 12 Months?: No CIRT Risk: No Elopement Risk: No Does patient have medical clearance?: Yes  Child/Adolescent Assessment Running Away Risk: Denies (Patient is an adult)  Disposition:  Disposition Initial Assessment Completed for this Encounter: Yes Disposition of Patient: Other dispositions (To be seen by Psych MD) Other disposition(s): Other (Comment) (To be seen by Psych MD)  On Site Evaluation by:   Reviewed with Physician:     Lilyan Gilfordalvin J. Keyla Milone, MS, LCAS, LPC, NCC, CCSI 12/25/2015 6:28 PM

## 2015-12-25 NOTE — ED Notes (Signed)
Pt states that he has been feeling suicidal since last night. Pt states that he does not know why he is suicidal, he "just" is. Pt states that he would kill himself by taking all of his pills. Denies HI. Denies substance abuse.

## 2015-12-25 NOTE — ED Provider Notes (Signed)
[facility] Emergency Department Provider Note  ____________________________________________  Time seen: Approximately 7:20 PM  I have reviewed the triage vital signs and the nursing notes.   HISTORY  Chief Complaint Suicidal  History is limited by Limited cooperation, history of mental illness  HPI Finneus Kaneshiro is a 44 y.o. male with an extensive psychiatric history who presents with complaints of feeling suicidal since last night.  He reports gradual onset of worsening depression, now anything in particular, just "everything" makes it worse.  Plan for suicide is overdose by taking all his medications.  Per guardian, pt has extensive psych history with ingestion of objects for attention requiring surgical intervention and numerous reports of SI.  Currently denies HI and hallucinations.  Denies any medical complaints or concerns.  Past Medical History  Diagnosis Date  . Asthma   . Hypertension   . Depression     Patient Active Problem List   Diagnosis Date Noted  . Bipolar 1 disorder, depressed, partial remission (HCC) 12/04/2015  . Asthma 12/04/2015  . Hypertension 12/04/2015  . Suicidal ideation 12/04/2015  . Tardive dyskinesia 12/04/2015    Past Surgical History  Procedure Laterality Date  . Hernia repair      Current Outpatient Rx  Name  Route  Sig  Dispense  Refill  . albuterol (PROVENTIL HFA;VENTOLIN HFA) 108 (90 BASE) MCG/ACT inhaler   Inhalation   Inhale 2 puffs into the lungs every 4 (four) hours as needed for wheezing or shortness of breath.         . ARIPiprazole (ABILIFY) 5 MG tablet   Oral   Take 5 mg by mouth daily.         Marland Kitchen atorvastatin (LIPITOR) 40 MG tablet   Oral   Take 40 mg by mouth every evening.         . budesonide-formoterol (SYMBICORT) 160-4.5 MCG/ACT inhaler   Inhalation   Inhale 2 puffs into the lungs 2 (two) times daily.         . butalbital-acetaminophen-caffeine (FIORICET, ESGIC) 50-325-40 MG  tablet   Oral   Take 1 tablet by mouth every 8 (eight) hours as needed for headache.         . clobetasol ointment (TEMOVATE) 0.05 %   Topical   Apply 1 application topically 2 (two) times daily.         . Emollient (AQUAPHOR ADVANCED THERAPY) OINT   Apply externally   Apply 1 application topically daily as needed.         . fluocinonide (LIDEX) 0.05 % external solution   Topical   Apply 1 application topically 2 (two) times daily.         Marland Kitchen FLUoxetine (PROZAC) 40 MG capsule   Oral   Take 40 mg by mouth daily.         . fluticasone (FLONASE) 50 MCG/ACT nasal spray   Each Nare   Place 1 spray into both nostrils daily.         . hydrocortisone (DERMAREST ECZEMA) 1 % lotion   Topical   Apply 1 application topically 2 (two) times daily.         Marland Kitchen ibuprofen (ADVIL,MOTRIN) 200 MG tablet   Oral   Take 200 mg by mouth every 6 (six) hours as needed.         Marland Kitchen lisinopril (PRINIVIL,ZESTRIL) 10 MG tablet   Oral   Take 10 mg by mouth daily.         Marland Kitchen omeprazole (  PRILOSEC) 20 MG capsule   Oral   Take 40 mg by mouth daily.         . Oxcarbazepine (TRILEPTAL) 300 MG tablet   Oral   Take 300 mg by mouth 2 (two) times daily.         . polyvinyl alcohol (LIQUIFILM TEARS) 1.4 % ophthalmic solution   Both Eyes   Place 1 drop into both eyes as needed for dry eyes.         Marland Kitchen. QUEtiapine (SEROQUEL) 50 MG tablet   Oral   Take 50 mg by mouth 3 (three) times daily as needed.         . traZODone (DESYREL) 100 MG tablet   Oral   Take 100 mg by mouth at bedtime.           Allergies Sulfa antibiotics  No family history on file.  Social History Social History  Substance Use Topics  . Smoking status: Never Smoker   . Smokeless tobacco: None  . Alcohol Use: No    Review of Systems Constitutional: No fever/chills Eyes: No visual changes. ENT: No sore throat. Cardiovascular: Denies chest pain. Respiratory: Denies shortness of  breath. Gastrointestinal: No abdominal pain.  No nausea, no vomiting.  No diarrhea.  No constipation. Genitourinary: Negative for dysuria. Musculoskeletal: Negative for back pain. Skin: Negative for rash. Neurological: Negative for headaches, focal weakness or numbness. Psych:  Endorses SI and worsening depression  10-point ROS otherwise negative.  ____________________________________________   PHYSICAL EXAM:  VITAL SIGNS: ED Triage Vitals  Enc Vitals Group     BP 12/25/15 1702 154/70 mmHg     Pulse Rate 12/25/15 1702 76     Resp 12/25/15 1702 18     Temp 12/25/15 1702 98.4 F (36.9 C)     Temp Source 12/25/15 1702 Oral     SpO2 12/25/15 1702 96 %     Weight 12/25/15 1702 218 lb (98.884 kg)     Height 12/25/15 1702 5\' 2"  (1.575 m)     Head Cir --      Peak Flow --      Pain Score 12/25/15 1717 0     Pain Loc --      Pain Edu? --      Excl. in GC? --     Constitutional: Alert and oriented. Well appearing and in no acute distress. Eyes: Conjunctivae are normal. PERRL. EOMI. Head: Atraumatic. Nose: No congestion/rhinnorhea. Mouth/Throat: Mucous membranes are moist.  Oropharynx non-erythematous. Neck: No stridor.   Cardiovascular: Normal rate, regular rhythm. Grossly normal heart sounds.  Good peripheral circulation. Respiratory: Normal respiratory effort.  No retractions. Lungs CTAB. Gastrointestinal: Soft and nontender. No distention. No abdominal bruits. No CVA tenderness. Musculoskeletal: No lower extremity tenderness nor edema.  No joint effusions. Neurologic:  Normal speech and language. No gross focal neurologic deficits are appreciated.  Skin:  Skin is warm, dry and intact. No rash noted. Psychiatric: Mood and affect are flat. Endorses active SI with a plan.  ____________________________________________   LABS (all labs ordered are listed, but only abnormal results are displayed)  Labs Reviewed  COMPREHENSIVE METABOLIC PANEL - Abnormal; Notable for the  following:    Potassium 3.4 (*)    Glucose, Bld 117 (*)    ALT 14 (*)    All other components within normal limits  ACETAMINOPHEN LEVEL - Abnormal; Notable for the following:    Acetaminophen (Tylenol), Serum <10 (*)    All other components within normal limits  CBC - Abnormal; Notable for the following:    MCV 79.0 (*)    All other components within normal limits  URINE DRUG SCREEN, QUALITATIVE ([facility] ONLY) - Abnormal; Notable for the following:    Tricyclic, Ur Screen POSITIVE (*)    Amphetamines, Ur Screen POSITIVE (*)    Barbiturates, Ur Screen POSITIVE (*)    All other components within normal limits  ETHANOL  SALICYLATE LEVEL   ____________________________________________  EKG  Not indicated ____________________________________________  RADIOLOGY   No results found.  ____________________________________________   PROCEDURES  Procedure(s) performed: None  Critical Care performed: No ____________________________________________   INITIAL IMPRESSION / ASSESSMENT AND PLAN / ED COURSE  Pertinent labs & imaging results that were available during my care of the patient were reviewed by me and considered in my medical decision making (see chart for details).  Calvin already spoke with the patient and the guardian and he does have an extensive history of [place] and mental illness.  The guardian warned Jerilynn Som that the patient will eat things so that he will require admission.  We will keep a close eye on him and await Dr. Toni Amend evaluation.  A review of medical records shows that Dr. Toni Amend has seen him in the past.  I feel that this presentation is very similar to his prior presentation and that he does not represent an active danger of killing himself and he very much wants to be here rather than at the group home.  I will not place him under IVC at this time and if he decides that he wants to go back to his home that is  acceptable.  No acute medical illness currently.  ____________________________________________  FINAL CLINICAL IMPRESSION(S) / ED DIAGNOSES  Final diagnoses:  Bipolar 1 disorder, depressed, partial remission (HCC)  Suicidal ideation      NEW MEDICATIONS STARTED DURING THIS VISIT:  New Prescriptions   No medications on file     Loleta Rose, MD 12/25/15 2330

## 2015-12-26 DIAGNOSIS — R45851 Suicidal ideations: Secondary | ICD-10-CM

## 2015-12-26 DIAGNOSIS — F1510 Other stimulant abuse, uncomplicated: Secondary | ICD-10-CM

## 2015-12-26 NOTE — Discharge Instructions (Signed)
Bipolar Disorder °Bipolar disorder is a mental illness. The term bipolar disorder actually is used to describe a group of disorders that all share varying degrees of emotional highs and lows that can interfere with daily functioning, such as work, school, or relationships. Bipolar disorder also can lead to drug abuse, hospitalization, and suicide. °The emotional highs of bipolar disorder are periods of elation or irritability and high energy. These highs can range from a mild form (hypomania) to a severe form (mania). People experiencing episodes of hypomania may appear energetic, excitable, and highly productive. People experiencing mania may behave impulsively or erratically. They often make poor decisions. They may have difficulty sleeping. The most severe episodes of mania can involve having very distorted beliefs or perceptions about the world and seeing or hearing things that are not real (psychotic delusions and hallucinations).  °The emotional lows of bipolar disorder (depression) also can range from mild to severe. Severe episodes of bipolar depression can involve psychotic delusions and hallucinations. °Sometimes people with bipolar disorder experience a state of mixed mood. Symptoms of hypomania or mania and depression are both present during this mixed-mood episode. °SIGNS AND SYMPTOMS °There are signs and symptoms of the episodes of hypomania and mania as well as the episodes of depression. The signs and symptoms of hypomania and mania are similar but vary in severity. They include: °· Inflated self-esteem or feeling of increased self-confidence. °· Decreased need for sleep. °· Unusual talkativeness (rapid or pressured speech) or the feeling of a need to keep talking. °· Sensation of racing thoughts or constant talking, with quick shifts between topics that may or may not be related (flight of ideas). °· Decreased ability to focus or concentrate. °· Increased purposeful activity, such as work, studies,  or social activity, or nonproductive activity, such as pacing, squirming and fidgeting, or finger and toe tapping. °· Impulsive behavior and use of poor judgment, resulting in high-risk activities, such as having unprotected sex or spending excessive amounts of money. °Signs and symptoms of depression include the following:  °· Feelings of sadness, hopelessness, or helplessness. °· Frequent or uncontrollable episodes of crying. °· Lack of feeling anything or caring about anything. °· Difficulty sleeping or sleeping too much.  °· Inability to enjoy the things you used to enjoy.   °· Desire to be alone all the time.   °· Feelings of guilt or worthlessness.  °· Lack of energy or motivation.   °· Difficulty concentrating, remembering, or making decisions.  °· Change in appetite or weight beyond normal fluctuations. °· Thoughts of death or the desire to harm yourself. °DIAGNOSIS  °Bipolar disorder is diagnosed through an assessment by your caregiver. Your caregiver will ask questions about your emotional episodes. There are two main types of bipolar disorder. People with type I bipolar disorder have manic episodes with or without depressive episodes. People with type II bipolar disorder have hypomanic episodes and major depressive episodes, which are more serious than mild depression. The type of bipolar disorder you have can make an important difference in how your illness is monitored and treated. °Your caregiver may ask questions about your medical history and use of alcohol or drugs, including prescription medication. Certain medical conditions and substances also can cause emotional highs and lows that resemble bipolar disorder (secondary bipolar disorder).  °TREATMENT  °Bipolar disorder is a long-term illness. It is best controlled with continuous treatment rather than treatment only when symptoms occur. The following treatments can be prescribed for bipolar disorders: °· Medication--Medication can be prescribed by  a doctor that   is an expert in treating mental disorders (psychiatrists). Medications called mood stabilizers are usually prescribed to help control the illness. Other medications are sometimes added if symptoms of mania, depression, or psychotic delusions and hallucinations occur despite the use of a mood stabilizer. °· Talk therapy--Some forms of talk therapy are helpful in providing support, education, and guidance. °A combination of medication and talk therapy is best for managing the disorder over time. A procedure in which electricity is applied to your brain through your scalp (electroconvulsive therapy) is used in cases of severe mania when medication and talk therapy do not work or work too slowly. °  °This information is not intended to replace advice given to you by your health care provider. Make sure you discuss any questions you have with your health care provider. °  °Document Released: 03/22/2001 Document Revised: 01/04/2015 Document Reviewed: 01/09/2013 °Elsevier Interactive Patient Education ©2016 Elsevier Inc. ° °Suicidal Feelings: How to Help Yourself °Suicide is the taking of one's own life. If you feel as though life is getting too tough to handle and are thinking about suicide, get help right away. To get help: °· Call your local emergency services (911 in the [place] °· Call a suicide hotline to speak with a trained counselor who understands how you are feeling. The following is a list of suicide hotlines in the [place]. For a list of hotlines in [place], visit www.suicide.org/hotlines/international/[place]-suicide-hotlines.html. °¨  1-800-273-TALK (1-800-273-8255). °¨  1-800-SUICIDE (1-800-784-2433). °¨  1-888-628-9454. This is a hotline for Spanish speakers. °¨  1-800-799-4TTY (1-800-799-4889). This is a hotline for TTY users. °¨  1-866-4-U-TREVOR (1-866-488-7386). This is a hotline for lesbian, gay, bisexual, transgender, or questioning youth. °· Contact a crisis center or a local  suicide prevention center. To find a crisis center or suicide prevention center: °¨ Call your local hospital, clinic, community service organization, mental health center, social service provider, or health department. Ask for assistance in connecting to a crisis center. °¨ Visit www.suicidepreventionlifeline.org/getinvolved/locator for a list of crisis centers in the [place], or visit www.suicideprevention.ca/thinking-about-suicide/find-a-crisis-centre for a list of centers in [place]. °· Visit the following websites: °¨  National Suicide Prevention Lifeline: www.suicidepreventionlifeline.org °¨  Hopeline: www.hopeline.com °¨  American Foundation for Suicide Prevention: www.afsp.org °¨  The Trevor Project (for lesbian, gay, bisexual, transgender, or questioning youth): www.thetrevorproject.org °HOW CAN I HELP MYSELF FEEL BETTER? °· Promise yourself that you will not do anything drastic when you have suicidal feelings. Remember, there is hope. Many people have gotten through suicidal thoughts and feelings, and you will, too. You may have gotten through them before, and this proves that you can get through them again. °· Let family, friends, teachers, or counselors know how you are feeling. Try not to isolate yourself from those who care about you. Remember, they will want to help you. Talk with someone every day, even if you do not feel sociable. Face-to-face conversation is best. °· Call a mental health professional and see one regularly. °· Visit your primary health care provider every year. °· Eat a well-balanced diet, and space your meals so you eat regularly. °· Get plenty of rest. °· Avoid alcohol and drugs, and remove them from your home. They will only make you feel worse. °· If you are thinking of taking a lot of medicine, give your medicine to someone who can give it to you one day at a time. If you are on antidepressants and are concerned you will overdose, let your health care provider know so he or  she can give you safer medicines. Ask   your mental health professional about the possible side effects of any medicines you are taking. °· Remove weapons, poisons, knives, and anything else that could harm you from your home. °· Try to stick to routines. Follow a schedule every day. Put self-care on your schedule. °· Make a list of realistic goals, and cross them off when you achieve them. Accomplishments give a sense of worth. °· Wait until you are feeling better before doing the things you find difficult or unpleasant. °· Exercise if you are able. You will feel better if you exercise for even a half hour each day. °· Go out in the sun or into nature. This will help you recover from depression faster. If you have a favorite place to walk, go there. °· Do the things that have always given you pleasure. Play your favorite music, read a good book, paint a picture, play your favorite instrument, or do anything else that takes your mind off your depression if it is safe to do. °· Keep your living space well lit. °· When you are feeling well, write yourself a letter about tips and support that you can read when you are not feeling well. °· Remember that life's difficulties can be sorted out with help. Conditions can be treated. You can work on thoughts and strategies that serve you well. °  °This information is not intended to replace advice given to you by your health care provider. Make sure you discuss any questions you have with your health care provider. °  °Document Released: 06/20/2003 Document Revised: 01/04/2015 Document Reviewed: 04/10/2014 °Elsevier Interactive Patient Education ©2016 Elsevier Inc. ° °

## 2015-12-26 NOTE — ED Notes (Signed)
BEHAVIORAL HEALTH ROUNDING Patient sleeping: No. Patient alert and oriented: yes Behavior appropriate: Yes.  ; If no, describe:  Nutrition and fluids offered: Yes  Toileting and hygiene offered: Yes  Sitter present: no Law enforcement present: Yes  

## 2015-12-26 NOTE — Consult Note (Signed)
[facility] Face-to-Face Psychiatry Consult   Reason for Consult:  Consult for 44 year old man who came to the emergency room initially reporting being suicidal Referring Physician:  Joni Fears Patient Identification: Ranier Coach MRN:  [id number] Principal Diagnosis: Suicidal ideation Diagnosis:   Patient Active Problem List   Diagnosis Date Noted  . Amphetamine abuse [F15.10] 12/26/2015  . Bipolar 1 disorder, depressed, partial remission ([place]) [F31.75] 12/04/2015  . Asthma [J45.909] 12/04/2015  . Hypertension [I10] 12/04/2015  . Suicidal ideation [R45.851] 12/04/2015  . Tardive dyskinesia [G24.01] 12/04/2015    Total Time spent with patient: 1 hour  Subjective:   Marc Shaw is a 44 y.o. male patient admitted with "I had a suicidal attempt".  HPI:  Patient was interviewed. Chart reviewed. Old notes reviewed. Particularly reviewed the intake note from the TTS worker last night who spoke to the patient's guardian. This 44 year old man had himself brought over to the emergency room saying he was having suicidal ideation. He tells me today that he had a "suicide attempt" but I inquired about this and it's very clear that he didn't actually do anything to try to hurt himself. He says that last night he was feeling sad because he misses his parents both of whom died about 15 years ago. He says it's because the holiday he gets sad. Patient says he's been compliant with all of his medicine. He denies that he's been abusing any drugs or alcohol. His drug screen is positive for amphetamine and there is no evidence that he is being prescribed that currently. Patient appears to have recently been in the process of relocating from [facility] to [facility]. He apparently gets his outpatient treatment still from a provider in [place]. He denies that he's been having any hallucinations no auditory or visual hallucinations. Says that his mood was sad last night but goes up and down and today he is feeling  better. He denies any problems with his current breathing.  Social history: Patient is living by himself. He says he still living in [place] although the notes seem to indicate that he was in the process of being transferred to [facility]. He tells me that he thinks he's probably going to go back to [place] to live. He has a legal guardian who is not a family member. Sounds like he's pretty lonely at baseline.  Medical history: History of high blood pressure, COPD, elevated cholesterol and history of tardive dyskinesia  Substance abuse history: Patient denies having alcohol or drug abuse problems. It does appear from his drug screen however that he may have been taking controlled substances that he is not prescribed.  Past Psychiatric History: It's reported that the patient has a long history of making suicidal statements in order to come into the hospital as a way of coping with stress. Apparently in [place] he became a very frequent user of hospital level services to the point that a specific plan had to be made to do with him. There is evidently no evidence that he is ever tried to kill himself in the past. He is on chronic psychiatric medicine including a long-acting injectable but he doesn't remember the name of it. The diagnosis we are told he has his bipolar disorder. Denies any history of being violent with other people. Last psychiatric hospitalization was years ago.  Risk to Self: Suicidal Ideation: Yes-Currently Present Suicidal Intent: Yes-Currently Present Is patient at risk for suicide?: Yes Suicidal Plan?: Yes-Currently Present Specify Current Suicidal Plan: Overdose on medication Access  to Means: Yes Specify Access to Suicidal Means: Have medications What has been your use of drugs/alcohol within the last 12 months?: None How many times?: 1 Other Self Harm Risks: overdose in the past, 09/2015 Triggers for Past Attempts: None known Intentional Self Injurious Behavior:  None Risk to Others: Homicidal Ideation: No Thoughts of Harm to Others: No Current Homicidal Intent: No Current Homicidal Plan: No Access to Homicidal Means: No Identified Victim: None Reported History of harm to others?: No Assessment of Violence: None Noted Violent Behavior Description: None Reported Does patient have access to weapons?: No Criminal Charges Pending?: No Does patient have a court date: No Prior Inpatient Therapy: Prior Inpatient Therapy: Yes Prior Therapy Dates: 35 (He states he been inpatient with them 5 times) Prior Therapy Facilty/Provider(s): [facility], "[facility]"  Reason for Treatment: Depression Prior Outpatient Therapy: Prior Outpatient Therapy: Yes Prior Therapy Dates: 10/2015 Prior Therapy Facilty/Provider(s): Patient doesn't remember the name of the company Reason for Treatment: Depression-Medication Management Does patient have an ACCT team?: Unknown Does patient have Intensive In-House Services?  : No Does patient have [facility] services? : No Does patient have P4CC services?: No  Past Medical History:  Past Medical History  Diagnosis Date  . Asthma   . Hypertension   . Depression     Past Surgical History  Procedure Laterality Date  . Hernia repair     Family History: No family history on file. Family Psychiatric  History: Patient says his sister had a substance abuse problem but he denies any other family history at all. Social History:  History  Alcohol Use No     History  Drug Use Not on file    Social History   Social History  . Marital Status: Single    Spouse Name: N/A  . Number of Children: N/A  . Years of Education: N/A   Social History Main Topics  . Smoking status: Never Smoker   . Smokeless tobacco: None  . Alcohol Use: No  . Drug Use: None  . Sexual Activity: Not Asked   Other Topics Concern  . None   Social History Narrative   Additional Social History:    Pain Medications: See PTA Prescriptions:  See PTA Over the Counter: See PTA History of alcohol / drug use?: No history of alcohol / drug abuse Longest period of sobriety (when/how long): States he have no past history of abuse Negative Consequences of Use:  (States he have no past history of abuse) Withdrawal Symptoms:  (States he have no past history of abuse)                     Allergies:   Allergies  Allergen Reactions  . Sulfa Antibiotics Other (See Comments)    unknown    Labs:  Results for orders placed or performed during the hospital encounter of 12/25/15 (from the past 48 hour(s))  Comprehensive metabolic panel     Status: Abnormal   Collection Time: 12/25/15  5:08 PM  Result Value Ref Range   Sodium 139 135 - 145 mmol/L   Potassium 3.4 (L) 3.5 - 5.1 mmol/L   Chloride 109 101 - 111 mmol/L   CO2 25 22 - 32 mmol/L   Glucose, Bld 117 (H) 65 - 99 mg/dL   BUN 15 6 - 20 mg/dL   Creatinine, Ser 1.12 0.61 - 1.24 mg/dL   Calcium 9.0 8.9 - 10.3 mg/dL   Total Protein 6.9 6.5 - 8.1  g/dL   Albumin 4.1 3.5 - 5.0 g/dL   AST 18 15 - 41 U/L   ALT 14 (L) 17 - 63 U/L   Alkaline Phosphatase 82 38 - 126 U/L   Total Bilirubin 0.5 0.3 - 1.2 mg/dL   GFR calc non Af Amer >60 >60 mL/min   GFR calc Af Amer >60 >60 mL/min    Comment: (NOTE) The eGFR has been calculated using the CKD EPI equation. This calculation has not been validated in all clinical situations. eGFR's persistently <60 mL/min signify possible Chronic Kidney Disease.    Anion gap 5 5 - 15  Ethanol (ETOH)     Status: None   Collection Time: 12/25/15  5:08 PM  Result Value Ref Range   Alcohol, Ethyl (B) <5 <5 mg/dL    Comment:        LOWEST DETECTABLE LIMIT FOR SERUM ALCOHOL IS 5 mg/dL FOR MEDICAL PURPOSES ONLY   Salicylate level     Status: None   Collection Time: 12/25/15  5:08 PM  Result Value Ref Range   Salicylate Lvl <9.3 2.8 - 30.0 mg/dL  Acetaminophen level     Status: Abnormal   Collection Time: 12/25/15  5:08 PM  Result Value Ref Range    Acetaminophen (Tylenol), Serum <10 (L) 10 - 30 ug/mL    Comment:        THERAPEUTIC CONCENTRATIONS VARY SIGNIFICANTLY. A RANGE OF 10-30 ug/mL MAY BE AN EFFECTIVE CONCENTRATION FOR MANY PATIENTS. HOWEVER, SOME ARE BEST TREATED AT CONCENTRATIONS OUTSIDE THIS RANGE. ACETAMINOPHEN CONCENTRATIONS >150 ug/mL AT 4 HOURS AFTER INGESTION AND >50 ug/mL AT 12 HOURS AFTER INGESTION ARE OFTEN ASSOCIATED WITH TOXIC REACTIONS.   CBC     Status: Abnormal   Collection Time: 12/25/15  5:08 PM  Result Value Ref Range   WBC 8.5 3.8 - 10.6 K/uL   RBC 5.71 4.40 - 5.90 MIL/uL   Hemoglobin 14.9 13.0 - 18.0 g/dL   HCT 45.1 40.0 - 52.0 %   MCV 79.0 (L) 80.0 - 100.0 fL   MCH 26.1 26.0 - 34.0 pg   MCHC 33.0 32.0 - 36.0 g/dL   RDW 14.0 11.5 - 14.5 %   Platelets 178 150 - 440 K/uL  Urine Drug Screen, Qualitative ([facility] only)     Status: Abnormal   Collection Time: 12/25/15  5:08 PM  Result Value Ref Range   Tricyclic, Ur Screen POSITIVE (A) NONE DETECTED   Amphetamines, Ur Screen POSITIVE (A) NONE DETECTED   MDMA (Ecstasy)Ur Screen NONE DETECTED NONE DETECTED   Cocaine Metabolite,Ur [place] NONE DETECTED NONE DETECTED   Opiate, Ur Screen NONE DETECTED NONE DETECTED   Phencyclidine (PCP) Ur S NONE DETECTED NONE DETECTED   Cannabinoid 50 Ng, Ur [place] NONE DETECTED NONE DETECTED   Barbiturates, Ur Screen POSITIVE (A) NONE DETECTED   Benzodiazepine, Ur Scrn NONE DETECTED NONE DETECTED   Methadone Scn, Ur NONE DETECTED NONE DETECTED    Comment: (NOTE) 267  Tricyclics, urine               Cutoff 1000 ng/mL 200  Amphetamines, urine             Cutoff 1000 ng/mL 300  MDMA (Ecstasy), urine           Cutoff 500 ng/mL 400  Cocaine Metabolite, urine       Cutoff 300 ng/mL 500  Opiate, urine  Cutoff 300 ng/mL 600  Phencyclidine (PCP), urine      Cutoff 25 ng/mL 700  Cannabinoid, urine              Cutoff 50 ng/mL 800  Barbiturates, urine             Cutoff 200 ng/mL 900  Benzodiazepine, urine            Cutoff 200 ng/mL 1000 Methadone, urine                Cutoff 300 ng/mL 1100 1200 The urine drug screen provides only a preliminary, unconfirmed 1300 analytical test result and should not be used for non-medical 1400 purposes. Clinical consideration and professional judgment should 1500 be applied to any positive drug screen result due to possible 1600 interfering substances. A more specific alternate chemical method 1700 must be used in order to obtain a confirmed analytical result.  1800 Gas chromato graphy / mass spectrometry (GC/MS) is the preferred 1900 confirmatory method.     No current facility-administered medications for this encounter.   Current Outpatient Prescriptions  Medication Sig Dispense Refill  . albuterol (PROVENTIL HFA;VENTOLIN HFA) 108 (90 BASE) MCG/ACT inhaler Inhale 2 puffs into the lungs every 4 (four) hours as needed for wheezing or shortness of breath.    Marland Kitchen atorvastatin (LIPITOR) 40 MG tablet Take 40 mg by mouth every evening.    . budesonide-formoterol (SYMBICORT) 160-4.5 MCG/ACT inhaler Inhale 2 puffs into the lungs 2 (two) times daily.    Marland Kitchen FLUoxetine (PROZAC) 40 MG capsule Take 40 mg by mouth daily.    . fluticasone (FLONASE) 50 MCG/ACT nasal spray Place 1 spray into both nostrils daily.    Marland Kitchen lisinopril (PRINIVIL,ZESTRIL) 10 MG tablet Take 10 mg by mouth daily.    Marland Kitchen lithium carbonate (LITHOBID) 300 MG CR tablet Take 300 mg by mouth 2 (two) times daily.    Marland Kitchen omeprazole (PRILOSEC) 20 MG capsule Take 40 mg by mouth daily.    . Oxcarbazepine (TRILEPTAL) 300 MG tablet Take 300 mg by mouth 2 (two) times daily.    . traZODone (DESYREL) 100 MG tablet Take 100 mg by mouth at bedtime.      Musculoskeletal: Strength & Muscle Tone: decreased Gait & Station: ataxic Patient leans: N/A  Psychiatric Specialty Exam: Review of Systems  Constitutional: Negative.   HENT: Negative.   Eyes: Negative.   Respiratory: Negative.   Cardiovascular: Negative.    Gastrointestinal: Negative.   Musculoskeletal: Negative.   Skin: Negative.   Neurological: Negative.   Psychiatric/Behavioral: Negative for depression, suicidal ideas, hallucinations, memory loss and substance abuse. The patient is not nervous/anxious and does not have insomnia.     Blood pressure 152/81, pulse 71, temperature 97.8 F (36.6 C), temperature source Oral, resp. rate 18, height 5' 2"  (1.575 m), weight 98.884 kg (218 lb), SpO2 97 %.Body mass index is 39.86 kg/(m^2).  General Appearance: Disheveled  Eye Sport and exercise psychologist::  Fair  Speech:  Slow  Volume:  Decreased  Mood:  Euthymic  Affect:  Constricted  Thought Process:  Linear  Orientation:  Full (Time, Place, and Person)  Thought Content:  Negative  Suicidal Thoughts:  No  Homicidal Thoughts:  No  Memory:  Immediate;   Good Recent;   Fair Remote;   Fair  Judgement:  Impaired  Insight:  Shallow  Psychomotor Activity:  Decreased and TD  Concentration:  Fair  Recall:  [place]  Language: Fair  Akathisia:  No  Handed:  Right  AIMS (if indicated):     Assets:  Agricultural consultant Housing Resilience Social Support  ADL's:  Intact  Cognition: WNL  Sleep:      Treatment Plan Summary: Plan 43 year old man with a history of bipolar disorder and chronic mental health problems who came into the hospital claiming he had suicidal ideation in that he was sad and depressed. Symptoms were transient yesterday. He did not actually act on them. On evaluation today he denies any suicidal ideation. Suicidal ideation problem appears to be acutely resolved. History of bipolar disorder is on a [place] somewhat unstable but is back to baseline mood at this point. No sign of acute psychosis. Blood pressure is under reasonable control. No complaints of shortness of breath. Tardive dyskinesia is noted but appears to be stable. There is no other indication for hospital level treatment. Patient is not on  involuntary commitment and can be released from the emergency room. He will continue to follow-up with his outpatient psychiatric provider no change to medicine. Patient has been counseled to not miss use or use any medicines that he is not prescribed because of the risk of worsening his mental health problems.  Disposition: Patient does not meet criteria for psychiatric inpatient admission. Supportive therapy provided about ongoing stressors.  Elana Jian 12/26/2015 1:38 PM

## 2015-12-26 NOTE — ED Provider Notes (Signed)
-----------------------------------------   2:08 PM on 12/26/2015 -----------------------------------------  Case discussed with Dr. Toni Amendlapacs of psychiatry after his evaluation in the ED. Patient is medically stable with unremarkable vital signs, calm and comfortable. Per Dr. Toni Amendlapacs assessment, patient is psychiatrically stable as well and is currently denying any notable acute SI or HI or hallucinations.  Patient has an ACT team who he can follow up with closely. We'll discharge home with outpatient follow-up.  Marc CheekPhillip Marielys Trinidad, MD 12/26/15 1409

## 2015-12-26 NOTE — Progress Notes (Signed)
TTS has contacted the pts guardian Eric FormVick McNight @ [phone] and informed him that the pt is being prepared for for discharged. Pts guardian has requested that the pt call him personal to coordination discharge and transportation. TTS has relayed this message to the pt and provided the pt with the guardians' contact information.    12/26/2015 Marc FlashNicole Fariha Goto, MS, NCC, LPCA Therapeutic Triage Specialist

## 2015-12-26 NOTE — ED Provider Notes (Signed)
-----------------------------------------   8:22 AM on 12/26/2015 -----------------------------------------   Blood pressure 152/81, pulse 71, temperature 97.8 F (36.6 C), temperature source Oral, resp. rate 18, height 5\' 2"  (1.575 m), weight 218 lb (98.884 kg), SpO2 97 %.  The patient had no acute events since last update.  Calm and cooperative at this time.  Patient to be seen by psych     Rebecka ApleyAllison P Shell Blanchette, MD 12/26/15 [phone]

## 2015-12-26 NOTE — ED Notes (Signed)
Pt didn't want his lunch

## 2015-12-26 NOTE — ED Notes (Signed)
BEHAVIORAL HEALTH ROUNDING Patient sleeping: No. Patient alert and oriented: no Behavior appropriate: Yes.  ; If no, describe:  Nutrition and fluids offered: Yes  Toileting and hygiene offered: Yes  Sitter present: no Law enforcement present: Yes  

## 2015-12-26 NOTE — ED Notes (Addendum)
Wrong pt

## 2016-01-12 ENCOUNTER — Emergency Department: Payer: Medicare Other

## 2016-01-12 ENCOUNTER — Emergency Department
Admission: EM | Admit: 2016-01-12 | Discharge: 2016-01-12 | Disposition: A | Discharge status: Home / Self Care | Payer: Medicare Other | Attending: Emergency Medicine | Admitting: Emergency Medicine

## 2016-01-12 DIAGNOSIS — J45909 Unspecified asthma, uncomplicated: Secondary | ICD-10-CM

## 2016-01-12 DIAGNOSIS — Z7951 Long term (current) use of inhaled steroids: Secondary | ICD-10-CM | POA: Insufficient documentation

## 2016-01-12 DIAGNOSIS — I10 Essential (primary) hypertension: Secondary | ICD-10-CM | POA: Diagnosis not present

## 2016-01-12 DIAGNOSIS — Z79899 Other long term (current) drug therapy: Secondary | ICD-10-CM | POA: Diagnosis not present

## 2016-01-12 DIAGNOSIS — R0602 Shortness of breath: Secondary | ICD-10-CM | POA: Diagnosis present

## 2016-01-12 MED ORDER — PANTOPRAZOLE SODIUM 40 MG PO TBEC: 40.0000 mg | DELAYED_RELEASE_TABLET | Freq: Every day | ORAL | Status: DC

## 2016-01-12 MED ORDER — FLUOXETINE HCL 40 MG PO CAPS: 40.0000 mg | ORAL_CAPSULE | Freq: Every day | ORAL | Status: DC

## 2016-01-12 MED ORDER — ATORVASTATIN CALCIUM 20 MG PO TABS: 40.0000 mg | ORAL_TABLET | Freq: Every evening | ORAL | Status: DC

## 2016-01-12 MED ORDER — IPRATROPIUM-ALBUTEROL 0.5-2.5 (3) MG/3ML IN SOLN
3.0000 mL | Freq: Once | RESPIRATORY_TRACT | Status: AC
  Administered 2016-01-12: 3 mL via RESPIRATORY_TRACT
  Filled 2016-01-12: qty 3

## 2016-01-12 MED ORDER — OXCARBAZEPINE 300 MG PO TABS: 300.0000 mg | ORAL_TABLET | Freq: Two times a day (BID) | ORAL | Status: DC

## 2016-01-12 MED ORDER — LISINOPRIL 10 MG PO TABS
10.0000 mg | ORAL_TABLET | Freq: Every day | ORAL | Status: DC
Start: 2016-01-12 — End: 2016-01-12

## 2016-01-12 MED ORDER — ALBUTEROL SULFATE HFA 108 (90 BASE) MCG/ACT IN AERS: 1.0000 | INHALATION_SPRAY | RESPIRATORY_TRACT | Status: DC | PRN

## 2016-01-12 MED ORDER — FLUTICASONE PROPIONATE 50 MCG/ACT NA SUSP: 1.0000 | Freq: Every day | NASAL | Status: DC

## 2016-01-12 MED ORDER — BUDESONIDE-FORMOTEROL FUMARATE 160-4.5 MCG/ACT IN AERO: 2.0000 | INHALATION_SPRAY | Freq: Two times a day (BID) | RESPIRATORY_TRACT | Status: DC

## 2016-01-12 NOTE — Discharge Instructions (Signed)
Asthma, Adult Asthma is a condition of the lungs in which the airways tighten and narrow. Asthma can make it hard to breathe. Asthma cannot be cured, but medicine and lifestyle changes can help control it. Asthma may be started (triggered) by:  Animal skin flakes (dander).  Dust.  Cockroaches.  Pollen.  Mold.  Smoke.  Cleaning products.  Hair sprays or aerosol sprays.  Paint fumes or strong smells.  Cold air, weather changes, and winds.  Crying or laughing hard.  Stress.  Certain medicines or drugs.  Foods, such as dried fruit, potato chips, and sparkling grape juice.  Infections or conditions (colds, flu).  Exercise.  Certain medical conditions or diseases.  Exercise or tiring activities. HOME CARE   Take medicine as told by your doctor.  Use a peak flow meter as told by your doctor. A peak flow meter is a tool that measures how well the lungs are working.  Record and keep track of the peak flow meter's readings.  Understand and use the asthma action plan. An asthma action plan is a written plan for taking care of your asthma and treating your attacks.  To help prevent asthma attacks:  Do not smoke. Stay away from secondhand smoke.  Change your heating and air conditioning filter often.  Limit your use of fireplaces and wood stoves.  Get rid of pests (such as roaches and mice) and their droppings.  Throw away plants if you see mold on them.  Clean your floors. Dust regularly. Use cleaning products that do not smell.  Have someone vacuum when you are not home. Use a vacuum cleaner with a HEPA filter if possible.  Replace carpet with wood, tile, or vinyl flooring. Carpet can trap animal skin flakes and dust.  Use allergy-proof pillows, mattress covers, and box spring covers.  Wash bed sheets and blankets every week in hot water and dry them in a dryer.  Use blankets that are made of polyester or cotton.  Clean bathrooms and kitchens with bleach.  If possible, have someone repaint the walls in these rooms with mold-resistant paint. Keep out of the rooms that are being cleaned and painted.  Wash hands often. GET HELP IF:  You have make a whistling sound when breaking (wheeze), have shortness of breath, or have a cough even if taking medicine to prevent attacks.  The colored mucus you cough up (sputum) is thicker than usual.  The colored mucus you cough up changes from clear or white to yellow, green, gray, or bloody.  You have problems from the medicine you are taking such as:  A rash.  Itching.  Swelling.  Trouble breathing.  You need reliever medicines more than 2-3 times a week.  Your peak flow measurement is still at 50-79% of your personal best after following the action plan for 1 hour.  You have a fever. GET HELP RIGHT AWAY IF:   You seem to be worse and are not responding to medicine during an asthma attack.  You are short of breath even at rest.  You get short of breath when doing very little activity.  You have trouble eating, drinking, or talking.  You have chest pain.  You have a fast heartbeat.  Your lips or fingernails start to turn blue.  You are light-headed, dizzy, or faint.  Your peak flow is less than 50% of your personal best.   This information is not intended to replace advice given to you by your health care provider. Make sure   you discuss any questions you have with your health care provider.   Document Released: 06/01/2008 Document Revised: 09/04/2015 Document Reviewed: 07/13/2013 Elsevier Interactive Patient Education 2016 Elsevier Inc.  

## 2016-01-12 NOTE — ED Provider Notes (Signed)
Medical screening examination/treatment/procedure(s) were conducted as a shared visit with non-physician practitioner(s) and myself.  I personally evaluated the patient during the encounter.  ----------------------------------------- 5:06 PM on 01/12/2016 -----------------------------------------  Patient is resting comfortably, clear lungs. No distress. He is very amicable. He reports improvement after nebulizer. Based on his current symptomatology and history I suspect he does not likely have pneumonia but rather probably has mild asthma. Unfortunately guardian not presently with the patient. We will plan to hold him in the ER until guardian can pick him up.    Sharyn CreamerMark Karma Ansley, MD 01/12/16 [phone]

## 2016-01-12 NOTE — ED Notes (Signed)
Marc LittlerNicole Shaw [phone]

## 2016-01-12 NOTE — ED Notes (Signed)
Per [place]ica, CSW, APS has been contacted at this time regarding patient.

## 2016-01-12 NOTE — ED Notes (Signed)
Ems pt to lobby via wheelchair and luggage, pt got into an argument at his group home , left with all his belongings , while walking became short of breath , requesting to see social worker to get him back to [place] , sats 97%, BP 142/88, clear lung sounds , hx asthma

## 2016-01-12 NOTE — ED Notes (Signed)
Per BancroftMonica, CSW, Martha LakeNicole, [employer] of [employer], coming to pick patient up.

## 2016-01-12 NOTE — ED Notes (Signed)
Monica, CSW in ED at this time. Called number listed for Guardian and spoke with Noreene LarssonJill at [employer].

## 2016-01-12 NOTE — ED Notes (Signed)
Pt D/C into the care of Lorenza CambridgeNicole Keiffer, [employer] [facility] [employer]. Joni Reiningicole denies comments/concerns at this time. Pt ambulatory to the lobby at this time.

## 2016-01-12 NOTE — ED Provider Notes (Signed)
[facility] Emergency Department Provider Note ____________________________________________  Time seen: Approximately 11:33 AM  I have reviewed the triage vital signs and the nursing notes.   HISTORY  Chief Complaint Asthma   HPI Marc Shaw is a 45 y.o. male is here with complaint of shortness of breath. Patient has a history of asthma and used his albuterol inhaler prior to his arrival in the emergency room. He states that he was leaving a " home" after an argument and packed up all his belongings and began walking. While he is walking he became short of breath, used his albuterol inhaler and now feels better except for sore throat. He states that 2 weeks ago he was diagnosed with strep throat and took all of his antibiotics.It is unclear exactly where the patient was living at the time however he became mad at someone, packed up his belongings and started walking.  He states that he lives in [place] and is requesting a taxi ride to [place].   Telephone calls that were made by nurses it was revealed that the patient has a guardian and is "an incompetent adult". The person who currently is acting as Merchandiser, retailsupervisor at the facility states that patient's guardian is not working today and will also not be working tomorrow due to the holiday. They suggested that he be sent to a hotel for a homeless shelter. CSW Maxine GlennMonica was called and has spoken several times with this person to get the guardian to come pick up this patient. Patient cannot be discharged due to his incompetent status from the hospital at this time.    Past Medical History  Diagnosis Date  . Asthma   . Hypertension   . Depression     Patient Active Problem List   Diagnosis Date Noted  . 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 Bipolar 1 disorder, depressed, partial remission (HCC) 12/04/2015  . Asthma 12/04/2015  . Hypertension 12/04/2015  . Suicidal ideation 12/04/2015  . Tardive dyskinesia 12/04/2015     Past Surgical History  Procedure Laterality Date  . Hernia repair      Current Outpatient Rx  Name  Route  Sig  Dispense  Refill  . albuterol (PROVENTIL HFA;VENTOLIN HFA) 108 (90 BASE) MCG/ACT inhaler   Inhalation   Inhale 2 puffs into the lungs every 4 (four) hours as needed for wheezing or shortness of breath.         Marland Kitchen. atorvastatin (LIPITOR) 40 MG tablet   Oral   Take 40 mg by mouth every evening.         . budesonide-formoterol (SYMBICORT) 160-4.5 MCG/ACT inhaler   Inhalation   Inhale 2 puffs into the lungs 2 (two) times daily.         Marland Kitchen. FLUoxetine (PROZAC) 40 MG capsule   Oral   Take 40 mg by mouth daily.         . fluticasone (FLONASE) 50 MCG/ACT nasal spray   Each Nare   Place 1 spray into both nostrils daily.         Marland Kitchen. lisinopril (PRINIVIL,ZESTRIL) 10 MG tablet   Oral   Take 10 mg by mouth daily.         Marland Kitchen. lithium carbonate (LITHOBID) 300 MG CR tablet   Oral   Take 300 mg by mouth 2 (two) times daily.         Marland Kitchen. omeprazole (PRILOSEC) 20 MG capsule   Oral   Take 40 mg by mouth daily.         .Marland Kitchen  Oxcarbazepine (TRILEPTAL) 300 MG tablet   Oral   Take 300 mg by mouth 2 (two) times daily.         . traZODone (DESYREL) 100 MG tablet   Oral   Take 100 mg by mouth at bedtime.           Allergies Sulfa antibiotics  No family history on file.  Social History Social History  Substance Use Topics  . Smoking status: Never Smoker   . Smokeless tobacco: Not on file  . Alcohol Use: No    Review of Systems Constitutional: No fever/chills ENT: Slight sore throat. Cardiovascular: Denies chest pain. Respiratory: Resolving shortness of breath. Occasional cough Gastrointestinal:  No nausea, no vomiting.   Musculoskeletal: Negative for back pain. Skin: Negative for rash. Neurological: Negative for headaches, focal weakness or numbness.  10-point ROS otherwise negative.  ____________________________________________   PHYSICAL  EXAM:  VITAL SIGNS: ED Triage Vitals  Enc Vitals Group     BP 01/12/16 1118 150/73 mmHg     Pulse Rate 01/12/16 1118 91     Resp 01/12/16 1118 16     Temp 01/12/16 1118 97.9 F (36.6 C)     Temp Source 01/12/16 1118 Oral     SpO2 01/12/16 1118 95 %     Weight 01/12/16 1118 211 lb (95.709 kg)     Height 01/12/16 1118 5\' 2"  (1.575 m)     Head Cir --      Peak Flow --      Pain Score --      Pain Loc --      Pain Edu? --      Excl. in GC? --     Constitutional: Alert and oriented. Well appearing and in no acute distress. Eyes: Conjunctivae are normal. PERRL. EOMI. Head: Atraumatic. Nose: No congestion/rhinnorhea. EACs and TMs are clear bilaterally. Mouth/Throat: Mucous membranes are moist.  Oropharynx non-erythematous. Minimal posterior drainage seen. Neck: No stridor.   Hematological/Lymphatic/Immunilogical: No cervical lymphadenopathy. Cardiovascular: Normal rate, regular rhythm. Grossly normal heart sounds.  Good peripheral circulation. Respiratory: Normal respiratory effort.  No retractions. Lungs CTAB. No expiratory wheezes are present at this time the patient does not appear to be any acute distress or shortness of breath as he is talking in complete sentences without any difficulty. Occasional nonproductive cough is heard infrequently. Gastrointestinal: Soft and nontender. No distention.  Musculoskeletal: No lower extremity tenderness nor edema.  No joint effusions. Neurologic:  Normal speech and language. No gross focal neurologic deficits are appreciated. No gait instability. Skin:  Skin is warm, dry and intact. No rash noted. Psychiatric: Mood and affect are normal. Speech and behavior are normal.  ____________________________________________   LABS (all labs ordered are listed, but only abnormal results are displayed)  Labs Reviewed - No data to display  RADIOLOGY  Chest x-ray per radiologist shows mild opacification over the left retrocardiac region with may  vascular structures, but cannot exclude early infection. ____________________________________________   PROCEDURES  Procedure(s) performed: None  Critical Care performed: No  ____________________________________________   INITIAL IMPRESSION / ASSESSMENT AND PLAN / ED COURSE  Pertinent labs & imaging results that were available during my care of the patient were reviewed by me and considered in my medical decision making (see chart for details).  Someone from the facility talked to one of nurses and gave information that the patient is legally a "incompetent adult". Apparently the guardian for this person is not going to be in the office on Monday due  to holiday weekend. She is calling to try and make arrangements for the patient either at a hotel or at the homeless shelter but was told that we cannot discharge him due to his legal status.  ----------------------------------------- 5:00 PM on 01/12/2016  spoke with Dr.Quale and Dr. Darnelle Catalan. About this patient currently patient is being housed in flex. Patient was moved from room 47 to room 42 stitches are to the nurse's station. ----------------------------------------- 5:01 PM on 01/12/2016      received to the nurse and Irene CSW that the director of this program would be coming to pick up the patient tonight. Patient currently will stay in flex for now. -----------------------------------------   -----------------------------------------   ____________________________________________   FINAL CLINICAL IMPRESSION(S) / ED DIAGNOSES  Final diagnoses:  Asthma, unspecified asthma severity, uncomplicated      Tommi Rumps, PA-C 01/15/16 1405  Jennye Moccasin, MD 01/21/16 [phone]

## 2016-01-12 NOTE — ED Notes (Signed)
Spoke with [place]ica, CSW. Per [facility] patient was seen in December and had an address listed for [address], [place]. Monica, CSW states that she has no phone number listed for the address in [place]. This RN spoke with patient regarding the [place] address. Pt states that he does not know the phone number and that the home he used to live in was a halfway house he was placed in by his guardian at this time. This RN gave patient a tray of food and a coke at this time. Pt laying on bed, no acute distress noted at this time. Monica, CSW made aware.

## 2016-01-12 NOTE — Clinical Social Work Note (Signed)
Clinical Social Work Assessment  Patient Details  Name: Marc Shaw MRN: [id number] Date of Birth: 06/11/1971  Date of referral:  01/12/16               Reason for consult:  Housing Concerns/Homelessness, Mental Health Concerns, Discharge Planning (Cognitive Concerns)                Permission sought to share information with:   (The [employer]) Permission granted to share information::  Yes, Verbal Permission Granted  Name::        Agency::     Relationship::     Contact Information:     Housing/Transportation Living arrangements for the past 2 months:  Hotel/Motel, Boarding House, No permanent address Source of Information:  Patient, Outpatient Provider Patient Interpreter Needed:  None Criminal Activity/Legal Involvement Pertinent to Current Situation/Hospitalization:  No - Comment as needed Significant Relationships:  Marc Shaw Support Lives with:  Self Do you feel safe going back to the place where you live?    Need for family participation in patient care:     Care giving concerns:  Patient is transient and does not have a permanent home and is developmentally delayed with MI diagnosis.   Social Worker assessment / plan:  CSW received call from [employer] Nurse in ED, Marc MilletMegan, stating that patient arrived to the ED with his luggage stating he got into a fight with a person he was staying with and cannot return there. Patient was able to tell the nurse that the [employer] is his guardian. Patient's knowledge and history of facts was not clear. CSW researched history and patient had been in the ED in December and was staying with a Marc Shaw at [address] but there was no phone number and patient did not know the phone number. There was a name and number for a Marc Shaw who is listed in patient's record as patient's guardian. CSW attempted to call both numbers listed for Marc Shaw and the [phone] went straight to voicemail and the other number: [phone] was patient's own cell number. Patient  did have a number for the on call [employer] Shaw at [phone]. Patient's ED nurse had already called this number and spoken to Marc Shaw about the situation and the nurse stated that Marc Shaw had recommended sending patient to a hotel or homeless shelter. As this was not a viable option due to the [employer] already stating patient had been deemed incompetent, the nurse contacted this CSW.  CSW spoke with a Marc Shaw number and she stated that their [employer] agency is patient's guardian and that Marc Shaw works during the weekday and that their on call service is for weekends and after hours. Marc Shaw stated that she did not know what to do about this situation and stated that she had called her supervisor but had not received a call back. Marc Shaw was unable to tell CSW how patient was back in [place] and that the there had been some record that the patient had been in [place] at a hotel but she was not sure. Marc Shaw was not able to confirm if any of that information was factual. CSW urged Marc Shaw to attempt to contact someone else in her organization as patient was not a reliable historian and had difficulty piecing together a timeline and series of events.   CSW spoke with patient this afternoon and he stated that his guardian works for [employer] and that his name was Marc Shaw. Patient was able to describe events in simple terminology. Patient reported  that he was in a hotel in [place] and then later changed the information to being at a homeless shelter in [place]. Patient could not tell me how long ago he was at those places but did state that his guardian, Marc Shaw, from [facility] drove him to stay with a lady, Marc Shaw, at a home that takes people in who have no where to stay. Patient stated he got on his phone last night and one of the people there heard him and got mad at something he said and he stated he got into a verbal argument and decided to leave. Patient stated he had no contact numbers for anyone because his phone erased  all of his contacts when he restarted it. Because the on call [employer] rep did not know what do advise and they are his guardian, CSW contacted Shaw APS on call. CSW spoke with Marc Shaw and informed her of the situation and she stated she would have to call her supervisor. In the meantime, while awaiting a call back from Shaw, Marc Shaw called and stated that she is very familiar with patient and that he has a transient history of not staying in one place for very long. Marc Shaw stated that the [facility] sets patient Shaw in various hotels from time to time and he will be functional for about 2 weeks. She states that patient likes to be waited on and picks a hotel or hospital as his place of choice. Marc Shaw stated that if we faxed her patient's discharge papers, she would sign them and that they would put patient Shaw in a hotel until staff could get to him. This CSW explained that with them stating he is an incompetent adult and requiring to have them as a guardian, that we would not be comfortable with that plan and that ideally it would be best for someone from the [facility] agency to come pick patient Shaw from the hospital. Marc Shaw stated she understood and that she would try to find someone who could come from [place] to get him. Marc Shaw called CSW back about 20 minutes later and stated that she could not find anyone else and that she would be coming from [place] this evening to pick patient Shaw. CSW has updated patient's nurse, Marc Shaw, regarding Marc Shaw.   Employment status:  Disabled (Comment on whether or not currently receiving Disability) Insurance information:  Medicaid In [place] PT Recommendations:  Not assessed at this time Information / Referral to community resources:     Patient/Family's Response to care:  Patient cooperative and pleasant with CSW.  Patient/Family's Understanding of and Emotional Response to Diagnosis, Current Treatment, and Prognosis:  Unsure  of patient's total understanding of situation.   Emotional Assessment Appearance:  Appears stated age, Disheveled Attitude/Demeanor/Rapport:   (pleasant and calm) Affect (typically observed):  Calm, Appropriate Orientation:  Oriented to Self, Oriented to Place, Oriented to  Time Alcohol / Substance use:  Not Applicable Psych involvement (Current and /or in the community):  Outpatient Provider  Discharge Needs  Concerns to be addressed:  Care Coordination, Basic Needs, Cognitive Concerns Readmission within the last 30 days:  No Current discharge risk:  Homeless Barriers to Discharge:  Other (Awaiting Arrangements By [facility])   York Spaniel, LCSW 01/12/2016, 4:28 PM

## 2016-01-12 NOTE — ED Notes (Signed)
Spoke with [place]ica, CSW regarding patient situation. Pt states he has no where to go at this time. Pt states he was staying with a friend in a halfway house and had an argument and can no longer go back. Pt brought phone up to the desk for this RN to speak with [employer] representative Carney BernJean. Per Carney BernJean pt is originally from [place] area and is "an incompetent adult". Carney BernJean asked this RN about homeless shelters in the area to send pt too. After conferring with charge RN, Dorinda Hillonald, and Tammy SoursGreg, RN, this nurse explained to Carney BernJean that medically he had no reason to stay but we could not D/C patient to a homeless shelter given that he is an incompetent adult. Per Carney BernJean, who states she is in [place] and is more of an "emergency/crisis line", there is no one to come pick him up. This RN notified Dorinda Hillonald, Press photographerCharge Nurse, [place]Monica, CSW, and [place]onda, [place] of the events. Consult to SW pending. Pt states that he has a court appointed guardian due to his mother not taking care of him.  Carney BernJean [facility] REP) [phone]

## 2016-01-12 NOTE — ED Notes (Signed)
Pt states when leaving his home after argument he felt SOB. Hx of Asthma. Used his albuterol inhaler. Pt states he feels better now but has a sore throat.

## 2017-06-27 IMAGING — CR DG CHEST 2V
2 series · 2 of 2 positions shown · non-contrast
Comparison: None.

CLINICAL DATA: Shortness of breath.  History of asthma.

EXAM:
CHEST  2 VIEW

[chest pa]
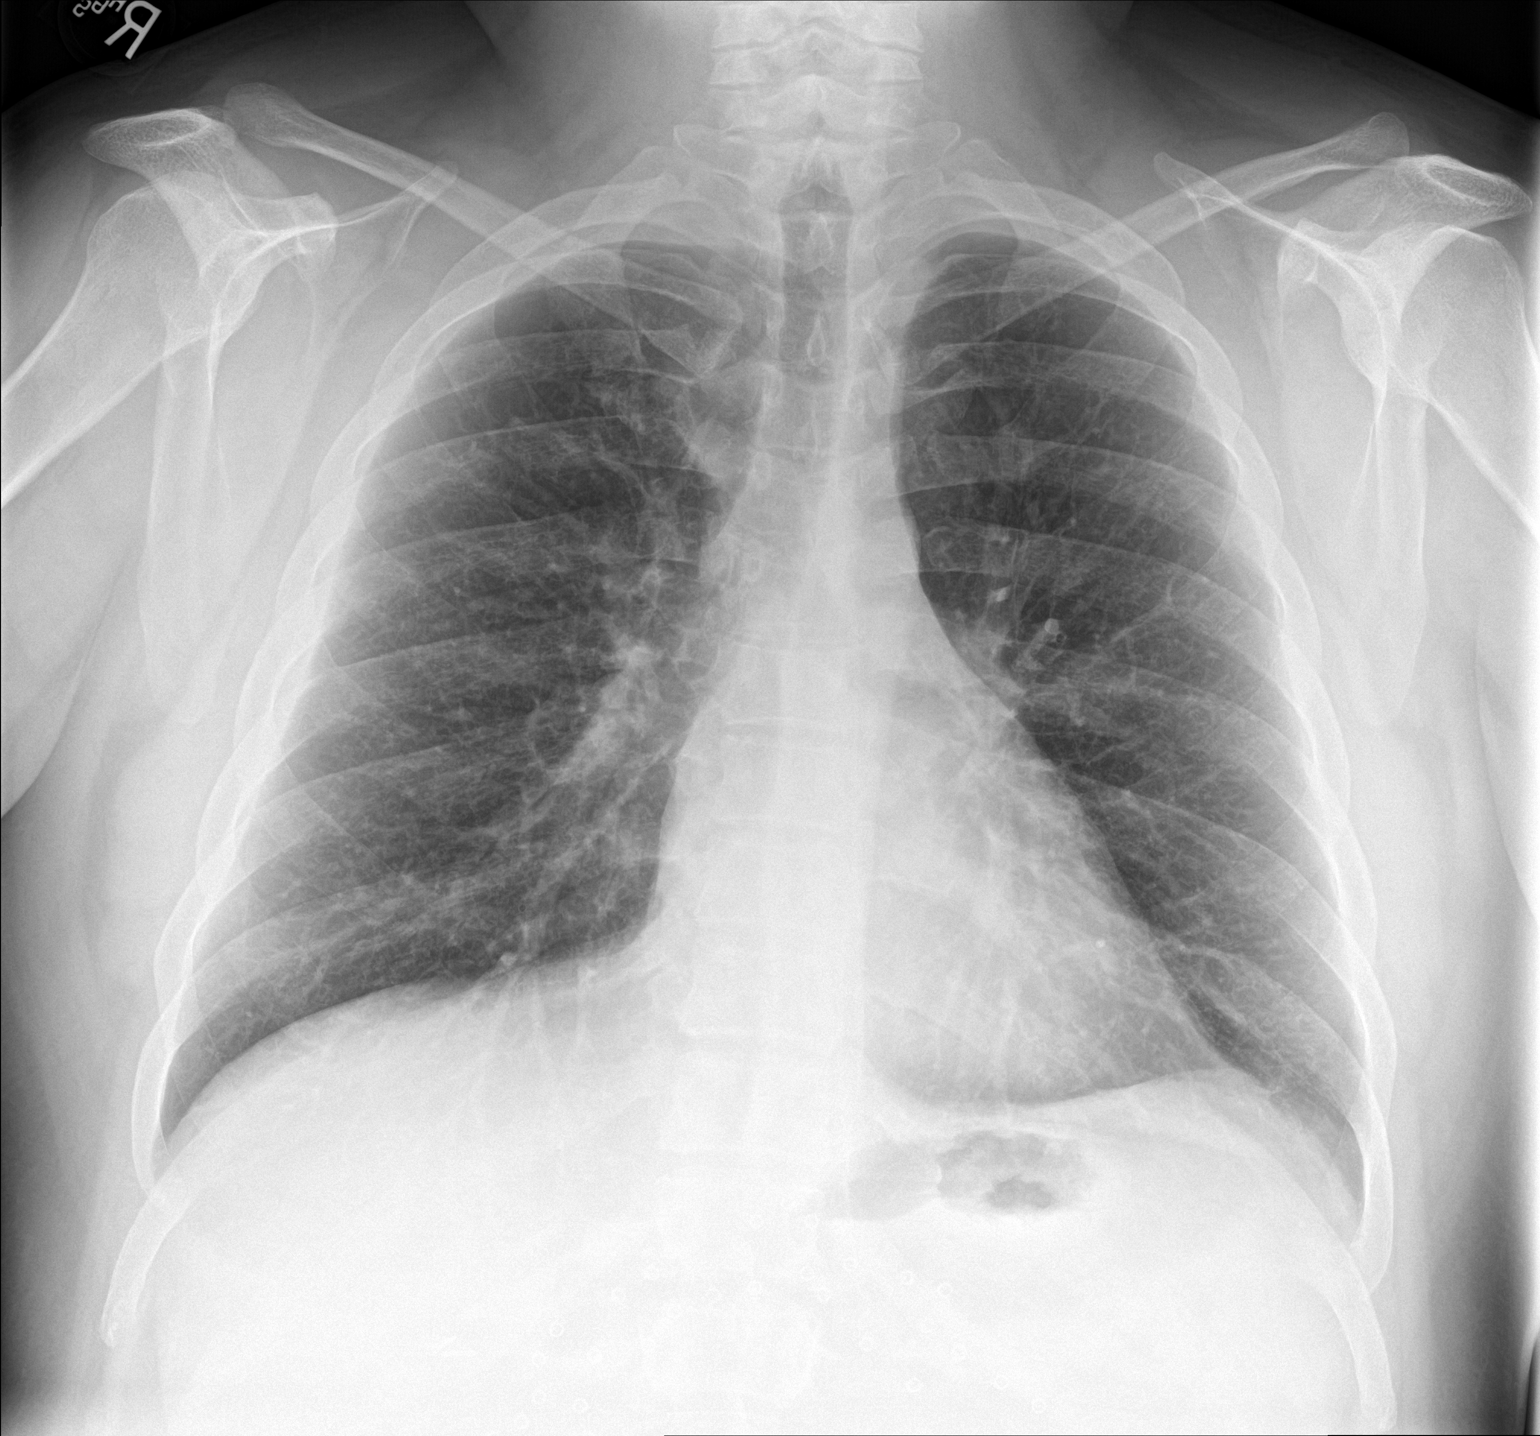

[chest lat]
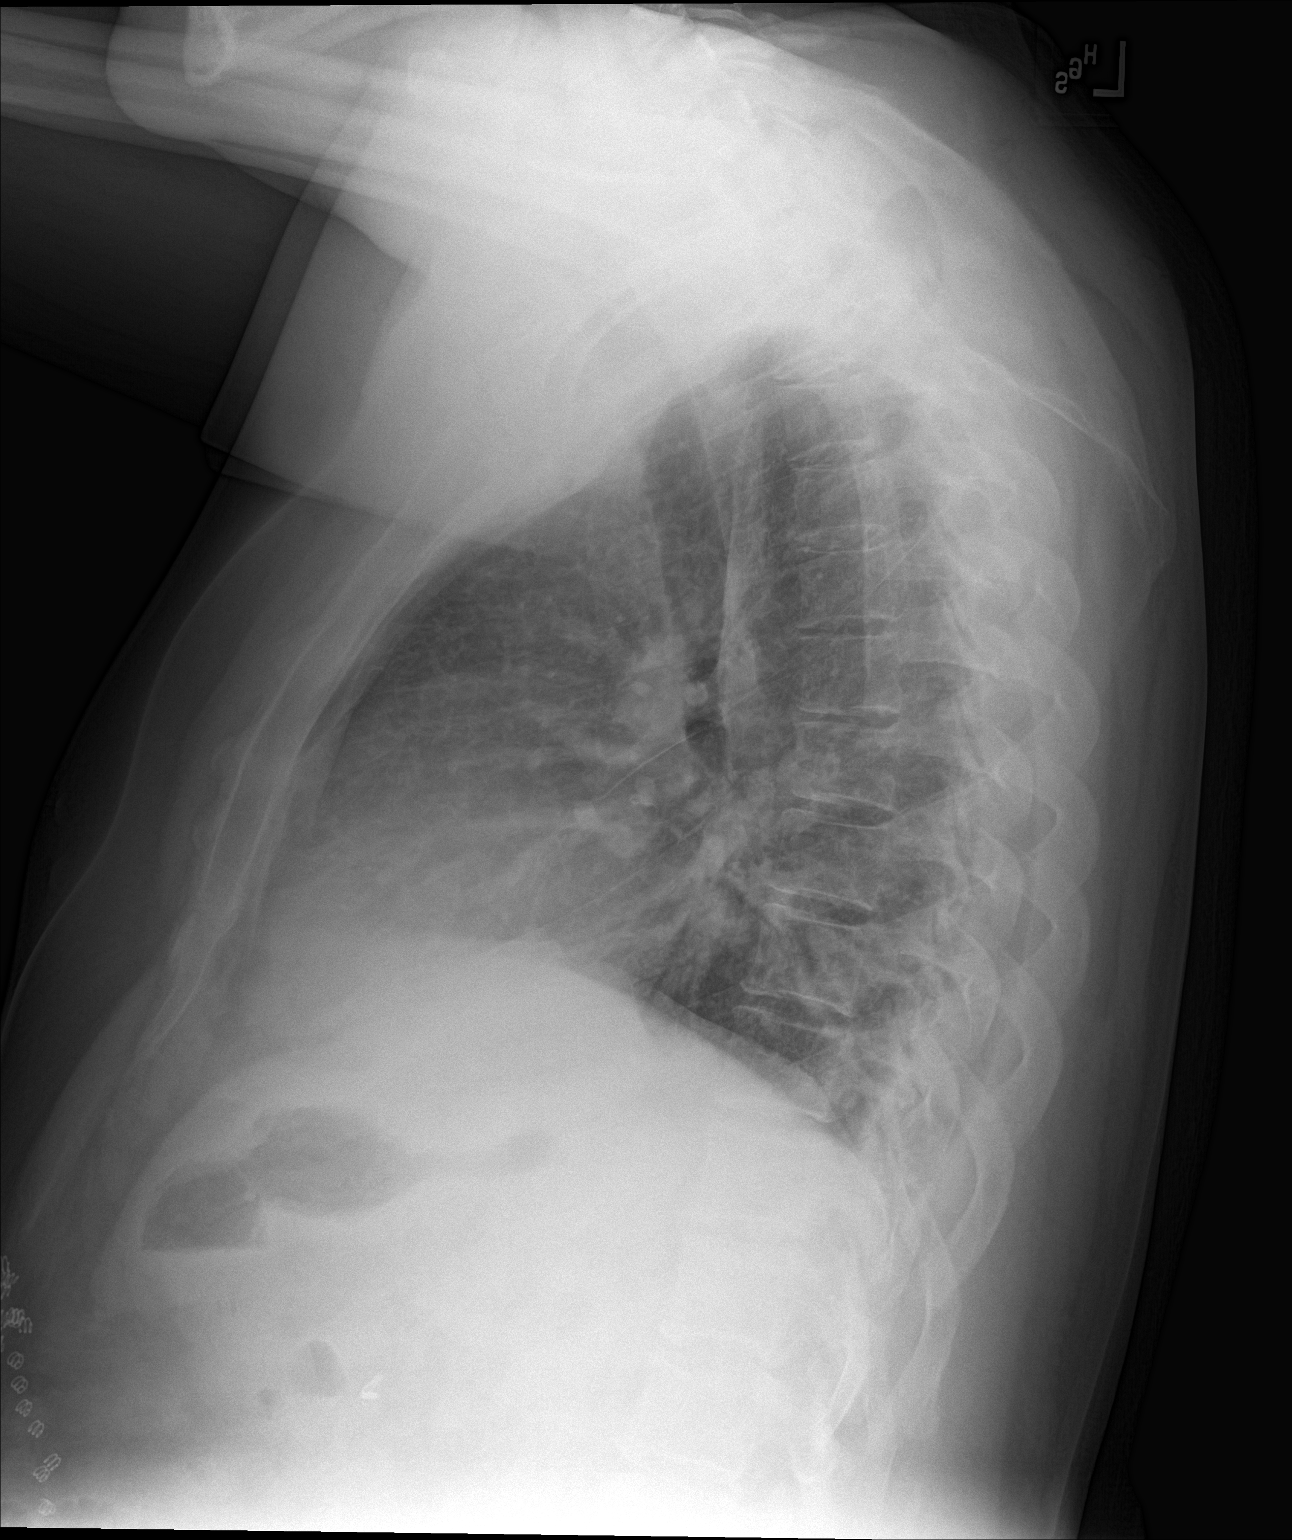

[2 of 2 positions shown; findings below may reference images not displayed]

FINDINGS: Lungs are adequately inflated with subtle opacification in the left
retrocardiac region likely vascular, although cannot exclude early
infection. No evidence of effusion. Cardiomediastinal silhouette is
within normal. There is evidence of previous hernia repair over the
anterior abdominal wall. Bony structures are within normal.
IMPRESSION: Mild opacification over the left retrocardiac region which may be
due to prominent vascular structures, although cannot exclude early
infection.

## 2019-12-26 ENCOUNTER — Emergency Department: Admit: 2019-12-26 | Payer: MEDICARE

## 2019-12-26 ENCOUNTER — Inpatient Hospital Stay
Admit: 2019-12-26 | Discharge: 2019-12-26 | Disposition: A | Discharge status: Home / Self Care | Payer: MEDICARE | Attending: Emergency Medicine

## 2019-12-26 DIAGNOSIS — R079 Chest pain, unspecified: Secondary | ICD-10-CM

## 2019-12-26 LAB — EKG, 12 LEAD, INITIAL
Atrial Rate: 69 {beats}/min
Atrial Rate: 81 {beats}/min
Calculated P Axis: 31 degrees
Calculated P Axis: 54 degrees
Calculated R Axis: 90 degrees
Calculated R Axis: 92 degrees
Calculated T Axis: 31 degrees
Calculated T Axis: 39 degrees
Diagnosis: NORMAL
Diagnosis: NORMAL
P-R Interval: 136 ms
P-R Interval: 152 ms
Q-T Interval: 360 ms
Q-T Interval: 406 ms
QRS Duration: 82 ms
QRS Duration: 84 ms
QTC Calculation (Bezet): 418 ms
QTC Calculation (Bezet): 435 ms
Ventricular Rate: 69 {beats}/min
Ventricular Rate: 81 {beats}/min

## 2019-12-26 LAB — METABOLIC PANEL, COMPREHENSIVE
A-G Ratio: 0.9 — ABNORMAL LOW (ref 1.1–2.2)
ALT (SGPT): 26 U/L (ref 12–78)
AST (SGOT): 19 U/L (ref 15–37)
Albumin: 3.4 g/dL — ABNORMAL LOW (ref 3.5–5.0)
Alk. phosphatase: 112 U/L (ref 45–117)
Anion gap: 5 mmol/L (ref 5–15)
BUN/Creatinine ratio: 28 — ABNORMAL HIGH (ref 12–20)
BUN: 36 MG/DL — ABNORMAL HIGH (ref 6–20)
Bilirubin, total: 0.2 MG/DL (ref 0.2–1.0)
CO2: 26 mmol/L (ref 21–32)
Calcium: 9 MG/DL (ref 8.5–10.1)
Chloride: 105 mmol/L (ref 97–108)
Creatinine: 1.29 MG/DL (ref 0.70–1.30)
GFR est AA: >60 mL/min/{1.73_m2} (ref >60)
GFR est non-AA: 59 mL/min/{1.73_m2} — ABNORMAL LOW (ref >60)
Globulin: 3.7 g/dL (ref 2.0–4.0)
Glucose: 84 mg/dL (ref 65–100)
Potassium: 4.1 mmol/L (ref 3.5–5.1)
Protein, total: 7.1 g/dL (ref 6.4–8.2)
Sodium: 136 mmol/L (ref 136–145)

## 2019-12-26 LAB — CBC WITH AUTOMATED DIFF
ABS. BASOPHILS: 0 10*3/uL (ref 0.0–0.1)
ABS. EOSINOPHILS: 0.2 10*3/uL (ref 0.0–0.4)
ABS. IMM. GRANS.: 0 10*3/uL (ref 0.00–0.04)
ABS. LYMPHOCYTES: 1.5 10*3/uL (ref 0.8–3.5)
ABS. MONOCYTES: 0.8 10*3/uL (ref 0.0–1.0)
ABS. NEUTROPHILS: 3.7 10*3/uL (ref 1.8–8.0)
ABSOLUTE NRBC: 0 10*3/uL (ref 0.00–0.01)
BASOPHILS: 1 % (ref 0–1)
EOSINOPHILS: 3 % (ref 0–7)
HCT: 46.6 % (ref 36.6–50.3)
HGB: 15.1 g/dL (ref 12.1–17.0)
IMMATURE GRANULOCYTES: 0 % (ref 0.0–0.5)
LYMPHOCYTES: 24 % (ref 12–49)
MCH: 25.3 PG — ABNORMAL LOW (ref 26.0–34.0)
MCHC: 32.4 g/dL (ref 30.0–36.5)
MCV: 78.1 FL — ABNORMAL LOW (ref 80.0–99.0)
MONOCYTES: 13 % (ref 5–13)
MPV: 9.3 FL (ref 8.9–12.9)
NEUTROPHILS: 59 % (ref 32–75)
NRBC: 0 PER 100 WBC
PLATELET: 208 10*3/uL (ref 150–400)
RBC: 5.97 M/uL — ABNORMAL HIGH (ref 4.10–5.70)
RDW: 18.8 % — ABNORMAL HIGH (ref 11.5–14.5)
WBC: 6.2 10*3/uL (ref 4.1–11.1)

## 2019-12-26 LAB — TROPONIN I
Troponin-I, Qt.: <0.05 ng/mL (ref <0.05)
Troponin-I, Qt.: <0.05 ng/mL (ref <0.05)

## 2019-12-26 LAB — SAMPLES BEING HELD

## 2019-12-26 LAB — EKG 12-LEAD
Atrial Rate: 69 {beats}/min
Atrial Rate: 81 {beats}/min
Diagnosis: NORMAL
Diagnosis: NORMAL
P Axis: 31 degrees
P Axis: 54 degrees
P-R Interval: 136 ms
P-R Interval: 152 ms
Q-T Interval: 360 ms
Q-T Interval: 406 ms
QRS Duration: 82 ms
QRS Duration: 84 ms
QTc Calculation (Bazett): 418 ms
QTc Calculation (Bazett): 435 ms
R Axis: 90 degrees
R Axis: 92 degrees
T Axis: 31 degrees
T Axis: 39 degrees
Ventricular Rate: 69 {beats}/min
Ventricular Rate: 81 {beats}/min

## 2019-12-26 LAB — COMPREHENSIVE METABOLIC PANEL
ALT: 26 U/L (ref 12–78)
AST: 19 U/L (ref 15–37)
Albumin/Globulin Ratio: 0.9 — ABNORMAL LOW (ref 1.1–2.2)
Albumin: 3.4 g/dL — ABNORMAL LOW (ref 3.5–5.0)
Alkaline Phosphatase: 112 U/L (ref 45–117)
Anion Gap: 5 mmol/L (ref 5–15)
BUN: 36 MG/DL — ABNORMAL HIGH (ref 6–20)
Bun/Cre Ratio: 28 — ABNORMAL HIGH (ref 12–20)
CO2: 26 mmol/L (ref 21–32)
Calcium: 9 MG/DL (ref 8.5–10.1)
Chloride: 105 mmol/L (ref 97–108)
Creatinine: 1.29 MG/DL (ref 0.70–1.30)
EGFR IF NonAfrican American: 59 mL/min/{1.73_m2} — ABNORMAL LOW (ref >60)
GFR African American: >60 mL/min/{1.73_m2} (ref >60)
Globulin: 3.7 g/dL (ref 2.0–4.0)
Glucose: 84 mg/dL (ref 65–100)
Potassium: 4.1 mmol/L (ref 3.5–5.1)
Sodium: 136 mmol/L (ref 136–145)
Total Bilirubin: 0.2 MG/DL (ref 0.2–1.0)
Total Protein: 7.1 g/dL (ref 6.4–8.2)

## 2019-12-26 LAB — CBC WITH AUTO DIFFERENTIAL
Basophils %: 1 % (ref 0–1)
Basophils Absolute: 0 10*3/uL (ref 0.0–0.1)
Eosinophils %: 3 % (ref 0–7)
Eosinophils Absolute: 0.2 10*3/uL (ref 0.0–0.4)
Granulocyte Absolute Count: 0 10*3/uL (ref 0.00–0.04)
Hematocrit: 46.6 % (ref 36.6–50.3)
Hemoglobin: 15.1 g/dL (ref 12.1–17.0)
Immature Granulocytes: 0 % (ref 0.0–0.5)
Lymphocytes %: 24 % (ref 12–49)
Lymphocytes Absolute: 1.5 10*3/uL (ref 0.8–3.5)
MCH: 25.3 PG — ABNORMAL LOW (ref 26.0–34.0)
MCHC: 32.4 g/dL (ref 30.0–36.5)
MCV: 78.1 FL — ABNORMAL LOW (ref 80.0–99.0)
MPV: 9.3 FL (ref 8.9–12.9)
Monocytes %: 13 % (ref 5–13)
Monocytes Absolute: 0.8 10*3/uL (ref 0.0–1.0)
NRBC Absolute: 0 10*3/uL (ref 0.00–0.01)
Neutrophils %: 59 % (ref 32–75)
Neutrophils Absolute: 3.7 10*3/uL (ref 1.8–8.0)
Nucleated RBCs: 0 PER 100 WBC
Platelets: 208 10*3/uL (ref 150–400)
RBC: 5.97 M/uL — ABNORMAL HIGH (ref 4.10–5.70)
RDW: 18.8 % — ABNORMAL HIGH (ref 11.5–14.5)
WBC: 6.2 10*3/uL (ref 4.1–11.1)

## 2019-12-26 LAB — TROPONIN
Troponin I: <0.05 ng/mL (ref <0.05)
Troponin I: <0.05 ng/mL (ref <0.05)

## 2019-12-26 MED ORDER — NITROGLYCERIN 0.4 MG SUBLINGUAL TAB
0.4 mg | SUBLINGUAL | Status: AC | PRN
Start: 2019-12-26 — End: 2019-12-26
  Administered 2019-12-26 (×3): via SUBLINGUAL

## 2019-12-26 MED ORDER — LIDOCAINE 2 % MUCOSAL SOLN
2 % | Freq: Once | Status: AC
Start: 2019-12-26 — End: 2019-12-26
  Administered 2019-12-26: 08:00:00 via ORAL

## 2019-12-26 MED ORDER — SODIUM CHLORIDE 0.9 % INJECTION
40 mg | Freq: Once | INTRAMUSCULAR | Status: AC
Start: 2019-12-26 — End: 2019-12-26
  Administered 2019-12-26: 08:00:00 via INTRAVENOUS

## 2019-12-26 MED FILL — PROTONIX 40 MG INTRAVENOUS SOLUTION: 40 mg | INTRAVENOUS | Qty: 40

## 2019-12-26 MED FILL — NITROGLYCERIN 0.4 MG SUBLINGUAL TAB: 0.4 mg | SUBLINGUAL | Qty: 25

## 2019-12-26 MED FILL — MAG-AL PLUS 200 MG-200 MG-20 MG/5 ML ORAL SUSPENSION: 200-200-20 mg/5 mL | ORAL | Qty: 30

## 2019-12-26 NOTE — ED Notes (Signed)
Pt denies new complaints, pt stated he missed is bus for [employer], bus ticket arranged for pt by [employer], pt allowed to wait in ED waiting room while he waits for bus ride

## 2019-12-26 NOTE — ED Notes (Signed)
Pt stated [place] bus station when he started having a sudden onset of chest pain radiating to the back, denies SOB,

## 2019-12-26 NOTE — ED Provider Notes (Signed)
HPI     48 year old male with a history of asthma, hypertension, cholesterol, mental health disorder, presents the emergency department coming off the [employer] bus with 10 minutes of sharp chest pain.  He states he feels short of breath but not nauseated or sweaty.  He states his feel like like the pain he had last October when he had a heart attack while living in [place] at [facility].  He has not taken anything for his pain.  He states he was just discharged from a psychiatric hospital in [place].  He denies any calf pain or swelling.  He denies feeling nauseated or sweaty.  He denies a fever or cough.  He denies any calf pain or swelling.  He states his pain is a 10 out of 10.    No past medical history on file.    No past surgical history on file.      No family history on file.    Social History     Socioeconomic History   ??? Marital status: Not on file     Spouse name: Not on file   ??? Number of children: Not on file   ??? Years of education: Not on file   ??? Highest education level: Not on file   Occupational History   ??? Not on file   Social Needs   ??? Financial resource strain: Not on file   ??? Food insecurity     Worry: Not on file     Inability: Not on file   ??? Transportation needs     Medical: Not on file     Non-medical: Not on file   Tobacco Use   ??? Smoking status: Not on file   Substance and Sexual Activity   ??? Alcohol use: Not on file   ??? Drug use: Not on file   ??? Sexual activity: Not on file   Lifestyle   ??? Physical activity     Days per week: Not on file     Minutes per session: Not on file   ??? Stress: Not on file   Relationships   ??? Social Wellsite geologist on phone: Not on file     Gets together: Not on file     Attends religious service: Not on file     Active member of club or organization: Not on file     Attends meetings of clubs or organizations: Not on file     Relationship status: Not on file   ??? Intimate partner violence     Fear of current or ex partner:  Not on file     Emotionally abused: Not on file     Physically abused: Not on file     Forced sexual activity: Not on file   Other Topics Concern   ??? Not on file   Social History Narrative   ??? Not on file         ALLERGIES: Aspirin    Review of Systems   Constitutional: Negative for fever.   HENT: Negative for congestion.    Eyes: Negative for visual disturbance.   Respiratory: Positive for chest tightness and shortness of breath.    Cardiovascular: Positive for chest pain. Negative for palpitations and leg swelling.   Gastrointestinal: Negative for abdominal pain, nausea and vomiting.   Endocrine: Negative for polyuria.   Genitourinary: Negative for dysuria.   Musculoskeletal: Negative for gait problem.   Neurological: Negative for  headaches.   Psychiatric/Behavioral: Negative for dysphoric mood.       Vitals:    12/26/19 0037   BP: 137/83   Pulse: 79   Resp: 23   Temp: 97.9 ??F (36.6 ??C)   SpO2: 97%   Weight: 81.6 kg (180 lb)   Height: 5\' 2"  (1.575 m)            Physical Exam  Constitutional:       General: He is not in acute distress.     Appearance: He is well-developed.   HENT:      Head: Normocephalic and atraumatic.      Mouth/Throat:      Pharynx: No oropharyngeal exudate.   Eyes:      General: No scleral icterus.        Right eye: No discharge.         Left eye: No discharge.      Pupils: Pupils are equal, round, and reactive to light.   Neck:      Musculoskeletal: Normal range of motion and neck supple.      Vascular: No JVD.   Cardiovascular:      Rate and Rhythm: Normal rate and regular rhythm.      Heart sounds: Normal heart sounds. No murmur.   Pulmonary:      Effort: Pulmonary effort is normal. No respiratory distress.      Breath sounds: Normal breath sounds. No stridor. No wheezing or rales.   Chest:      Chest wall: No tenderness.   Abdominal:      General: Bowel sounds are normal. There is no distension.      Palpations: Abdomen is soft. There is no mass.      Tenderness: There is no abdominal  tenderness. There is no guarding or rebound.   Musculoskeletal: Normal range of motion.   Skin:     General: Skin is warm and dry.      Capillary Refill: Capillary refill takes less than 2 seconds.      Findings: No rash.   Neurological:      Mental Status: He is oriented to person, place, and time.   Psychiatric:         Behavior: Behavior normal.         Thought Content: Thought content normal.         Judgment: Judgment normal.          MDM       Procedures      ED EKG interpretation:  Rhythm: normal sinus rhythm; and regular . Rate (approx.): 81; Axis: right axis deviation; P wave: normal; QRS interval: normal ; ST/T wave: non-specific changes;This EKG was interpreted by Zollie Pee, MD,ED Provider.       ED EKG interpretation:  Rhythm: normal sinus rhythm; and regular . Rate (approx.): 69; Axis: normal; P wave: normal; QRS interval: normal ; ST/T wave: non-specific changes; . This EKG was interpreted by Zollie Pee, MD,ED Provider.      Work-up in the ED is reassuring.  EKG does not show any ischemic changes with ongoing pain.  Patient reports pain but is resting comfortably with his legs crossed in no distress.  With serial troponins negative I feel patient can follow-up with his cardiologist when he gets back to [place].  Return precautions provided.

## 2019-12-26 NOTE — ED Triage Notes (Signed)
Pt stated [place] bus [place] when he started having a sudden onset of chest pain radiating to the back, denies SOB,

## 2019-12-26 NOTE — ED Provider Notes (Signed)
HPI     48 year old male with a history of asthma, hypertension, cholesterol, mental health disorder, presents the emergency department coming off the [employer] bus with 10 minutes of sharp chest pain.  He states he feels short of breath but not nauseated or sweaty.  He states his feel like like the pain he had last October when he had a heart attack while living in [place] at [facility].  He has not taken anything for his pain.  He states he was just discharged from a psychiatric hospital in [place].  He denies any calf pain or swelling.  He denies feeling nauseated or sweaty.  He denies a fever or cough.  He denies any calf pain or swelling.  He states his pain is a 10 out of 10.    No past medical history on file.    No past surgical history on file.      No family history on file.    Social History     Socioeconomic History   ??? Marital status: Not on file     Spouse name: Not on file   ??? Number of children: Not on file   ??? Years of education: Not on file   ??? Highest education level: Not on file   Occupational History   ??? Not on file   Social Needs   ??? Financial resource strain: Not on file   ??? Food insecurity     Worry: Not on file     Inability: Not on file   ??? Transportation needs     Medical: Not on file     Non-medical: Not on file   Tobacco Use   ??? Smoking status: Not on file   Substance and Sexual Activity   ??? Alcohol use: Not on file   ??? Drug use: Not on file   ??? Sexual activity: Not on file   Lifestyle   ??? Physical activity     Days per week: Not on file     Minutes per session: Not on file   ??? Stress: Not on file   Relationships   ??? Social Product manager on phone: Not on file     Gets together: Not on file     Attends religious service: Not on file     Active member of club or organization: Not on file     Attends meetings of clubs or organizations: Not on file     Relationship status: Not on file   ??? Intimate partner violence      Fear of current or ex partner: Not on file     Emotionally abused: Not on file     Physically abused: Not on file     Forced sexual activity: Not on file   Other Topics Concern   ??? Not on file   Social History Narrative   ??? Not on file         ALLERGIES: Aspirin    Review of Systems   Constitutional: Negative for fever.   HENT: Negative for congestion.    Eyes: Negative for visual disturbance.   Respiratory: Positive for chest tightness and shortness of breath.    Cardiovascular: Positive for chest pain. Negative for palpitations and leg swelling.   Gastrointestinal: Negative for abdominal pain, nausea and vomiting.   Endocrine: Negative for polyuria.   Genitourinary: Negative for dysuria.   Musculoskeletal: Negative for gait problem.   Neurological: Negative for  headaches.   Psychiatric/Behavioral: Negative for dysphoric mood.       Vitals:    12/26/19 0037   BP: 137/83   Pulse: 79   Resp: 23   Temp: 97.9 ??F (36.6 ??C)   SpO2: 97%   Weight: 81.6 kg (180 lb)   Height: 5\' 2"  (1.575 m)            Physical Exam  Constitutional:       General: He is not in acute distress.     Appearance: He is well-developed.   HENT:      Head: Normocephalic and atraumatic.      Mouth/Throat:      Pharynx: No oropharyngeal exudate.   Eyes:      General: No scleral icterus.        Right eye: No discharge.         Left eye: No discharge.      Pupils: Pupils are equal, round, and reactive to light.   Neck:      Musculoskeletal: Normal range of motion and neck supple.      Vascular: No JVD.   Cardiovascular:      Rate and Rhythm: Normal rate and regular rhythm.      Heart sounds: Normal heart sounds. No murmur.   Pulmonary:      Effort: Pulmonary effort is normal. No respiratory distress.      Breath sounds: Normal breath sounds. No stridor. No wheezing or rales.   Chest:      Chest wall: No tenderness.   Abdominal:      General: Bowel sounds are normal. There is no distension.      Palpations: Abdomen is soft. There is no mass.       Tenderness: There is no abdominal tenderness. There is no guarding or rebound.   Musculoskeletal: Normal range of motion.   Skin:     General: Skin is warm and dry.      Capillary Refill: Capillary refill takes less than 2 seconds.      Findings: No rash.   Neurological:      Mental Status: He is oriented to person, place, and time.   Psychiatric:         Behavior: Behavior normal.         Thought Content: Thought content normal.         Judgment: Judgment normal.          MDM       Procedures      ED EKG interpretation:  Rhythm: normal sinus rhythm; and regular . Rate (approx.): 81; Axis: right axis deviation; P wave: normal; QRS interval: normal ; ST/T wave: non-specific changes;This EKG was interpreted by , MD,ED Provider.       ED EKG interpretation:  Rhythm: normal sinus rhythm; and regular . Rate (approx.): 69; Axis: normal; P wave: normal; QRS interval: normal ; ST/T wave: non-specific changes; . This EKG was interpreted by Lanell Persons, MD,ED Provider.      Work-up in the ED is reassuring.  EKG does not show any ischemic changes with ongoing pain.  Patient reports pain but is resting comfortably with his legs crossed in no distress.  With serial troponins negative I feel patient can follow-up with his cardiologist when he gets back to [place].  Return precautions provided.

## 2019-12-26 NOTE — ED Notes (Signed)
Pt denies new complaints, pt stated he missed is bus for [place], bus ticket arranged for pt by Megan RN, pt allowed to wait in ED waiting room while he waits for bus ride
